# Patient Record
Sex: Female | Born: 1965 | Race: White | Hispanic: No | Smoking: Former smoker
Health system: Southern US, Community
[De-identification: ages and names within clinical notes are randomized; demographics above are authoritative.]

## PROBLEM LIST (undated history)

## (undated) DIAGNOSIS — K219 Gastro-esophageal reflux disease without esophagitis: Secondary | ICD-10-CM

## (undated) DIAGNOSIS — K259 Gastric ulcer, unspecified as acute or chronic, without hemorrhage or perforation: Secondary | ICD-10-CM

## (undated) DIAGNOSIS — T4145XA Adverse effect of unspecified anesthetic, initial encounter: Secondary | ICD-10-CM

## (undated) DIAGNOSIS — M519 Unspecified thoracic, thoracolumbar and lumbosacral intervertebral disc disorder: Secondary | ICD-10-CM

## (undated) DIAGNOSIS — R600 Localized edema: Secondary | ICD-10-CM

## (undated) DIAGNOSIS — R000 Tachycardia, unspecified: Secondary | ICD-10-CM

## (undated) DIAGNOSIS — M2550 Pain in unspecified joint: Secondary | ICD-10-CM

## (undated) DIAGNOSIS — M549 Dorsalgia, unspecified: Secondary | ICD-10-CM

## (undated) DIAGNOSIS — R569 Unspecified convulsions: Secondary | ICD-10-CM

## (undated) DIAGNOSIS — K529 Noninfective gastroenteritis and colitis, unspecified: Secondary | ICD-10-CM

## (undated) DIAGNOSIS — K5900 Constipation, unspecified: Secondary | ICD-10-CM

## (undated) DIAGNOSIS — IMO0001 Reserved for inherently not codable concepts without codable children: Secondary | ICD-10-CM

## (undated) DIAGNOSIS — E739 Lactose intolerance, unspecified: Secondary | ICD-10-CM

## (undated) DIAGNOSIS — T8859XA Other complications of anesthesia, initial encounter: Secondary | ICD-10-CM

## (undated) DIAGNOSIS — I10 Essential (primary) hypertension: Secondary | ICD-10-CM

## (undated) HISTORY — PX: BREAST BIOPSY: SHX20

## (undated) HISTORY — DX: Lactose intolerance, unspecified: E73.9

## (undated) HISTORY — DX: Reserved for inherently not codable concepts without codable children: IMO0001

## (undated) HISTORY — DX: Pain in unspecified joint: M25.50

## (undated) HISTORY — DX: Dorsalgia, unspecified: M54.9

## (undated) HISTORY — DX: Essential (primary) hypertension: I10

## (undated) HISTORY — DX: Tachycardia, unspecified: R00.0

## (undated) HISTORY — PX: TMJ ARTHROPLASTY: SHX1066

## (undated) HISTORY — DX: Gastric ulcer, unspecified as acute or chronic, without hemorrhage or perforation: K25.9

## (undated) HISTORY — DX: Localized edema: R60.0

## (undated) HISTORY — PX: WISDOM TOOTH EXTRACTION: SHX21

## (undated) HISTORY — PX: PILONIDAL CYST EXCISION: SHX744

## (undated) HISTORY — DX: Gastro-esophageal reflux disease without esophagitis: K21.9

## (undated) HISTORY — DX: Constipation, unspecified: K59.00

## (undated) HISTORY — DX: Unspecified thoracic, thoracolumbar and lumbosacral intervertebral disc disorder: M51.9

---
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
## 1986-04-03 HISTORY — PX: OTHER SURGICAL HISTORY: SHX169

## 1997-10-07 ENCOUNTER — Encounter: Admission: RE | Admit: 1997-10-07 | Discharge: 1998-01-05 | Payer: Self-pay | Admitting: *Deleted

## 1997-11-09 ENCOUNTER — Inpatient Hospital Stay (HOSPITAL_COMMUNITY): Admission: AD | Admit: 1997-11-09 | Discharge: 1997-11-13 | Payer: Self-pay | Admitting: Obstetrics and Gynecology

## 1997-12-16 ENCOUNTER — Other Ambulatory Visit: Admission: RE | Admit: 1997-12-16 | Discharge: 1997-12-16 | Payer: Self-pay | Admitting: Obstetrics and Gynecology

## 1999-05-24 ENCOUNTER — Other Ambulatory Visit: Admission: RE | Admit: 1999-05-24 | Discharge: 1999-05-24 | Payer: Self-pay | Admitting: Obstetrics and Gynecology

## 2002-07-08 ENCOUNTER — Other Ambulatory Visit: Admission: RE | Admit: 2002-07-08 | Discharge: 2002-07-08 | Payer: Self-pay | Admitting: Obstetrics and Gynecology

## 2002-11-30 ENCOUNTER — Inpatient Hospital Stay: Admission: AD | Admit: 2002-11-30 | Discharge: 2002-11-30 | Payer: Self-pay | Admitting: Obstetrics and Gynecology

## 2003-01-24 ENCOUNTER — Inpatient Hospital Stay (HOSPITAL_COMMUNITY): Admission: AD | Admit: 2003-01-24 | Discharge: 2003-01-26 | Payer: Self-pay | Admitting: Obstetrics and Gynecology

## 2003-03-03 ENCOUNTER — Other Ambulatory Visit: Admission: RE | Admit: 2003-03-03 | Discharge: 2003-03-03 | Payer: Self-pay | Admitting: Obstetrics and Gynecology

## 2004-01-02 DIAGNOSIS — J189 Pneumonia, unspecified organism: Secondary | ICD-10-CM

## 2004-07-11 ENCOUNTER — Ambulatory Visit: Payer: Self-pay | Admitting: Family Medicine

## 2004-10-31 ENCOUNTER — Ambulatory Visit: Payer: Self-pay | Admitting: Internal Medicine

## 2007-04-10 ENCOUNTER — Ambulatory Visit: Payer: Self-pay | Admitting: Family Medicine

## 2007-04-18 ENCOUNTER — Ambulatory Visit: Payer: Self-pay | Admitting: Family Medicine

## 2007-04-25 ENCOUNTER — Telehealth (INDEPENDENT_AMBULATORY_CARE_PROVIDER_SITE_OTHER): Payer: Self-pay | Admitting: Internal Medicine

## 2007-06-16 ENCOUNTER — Emergency Department (HOSPITAL_COMMUNITY): Admission: EM | Admit: 2007-06-16 | Discharge: 2007-06-16 | Payer: Self-pay | Admitting: Family Medicine

## 2007-12-10 ENCOUNTER — Ambulatory Visit: Payer: Self-pay | Admitting: Family Medicine

## 2007-12-10 DIAGNOSIS — K219 Gastro-esophageal reflux disease without esophagitis: Secondary | ICD-10-CM

## 2007-12-10 DIAGNOSIS — E669 Obesity, unspecified: Secondary | ICD-10-CM | POA: Insufficient documentation

## 2007-12-11 DIAGNOSIS — Z8679 Personal history of other diseases of the circulatory system: Secondary | ICD-10-CM | POA: Insufficient documentation

## 2009-02-23 ENCOUNTER — Emergency Department (HOSPITAL_COMMUNITY): Admission: EM | Admit: 2009-02-23 | Discharge: 2009-02-23 | Payer: Self-pay | Admitting: Family Medicine

## 2009-09-04 ENCOUNTER — Emergency Department (HOSPITAL_COMMUNITY): Admission: EM | Admit: 2009-09-04 | Discharge: 2009-09-04 | Payer: Self-pay | Admitting: Emergency Medicine

## 2010-04-03 LAB — HM HEPATITIS C SCREENING LAB: HM Hepatitis Screen: NEGATIVE

## 2010-04-03 LAB — HM HIV SCREENING LAB: HM HIV Screening: NEGATIVE

## 2010-05-27 ENCOUNTER — Inpatient Hospital Stay (INDEPENDENT_AMBULATORY_CARE_PROVIDER_SITE_OTHER)
Admission: RE | Admit: 2010-05-27 | Discharge: 2010-05-27 | Disposition: A | Discharge status: Home / Self Care | Payer: No Typology Code available for payment source | Source: Ambulatory Visit | Attending: Family Medicine | Admitting: Family Medicine

## 2010-05-27 DIAGNOSIS — J069 Acute upper respiratory infection, unspecified: Secondary | ICD-10-CM

## 2010-05-27 DIAGNOSIS — J029 Acute pharyngitis, unspecified: Secondary | ICD-10-CM

## 2010-06-10 ENCOUNTER — Ambulatory Visit: Payer: No Typology Code available for payment source | Admitting: Family Medicine

## 2010-06-13 ENCOUNTER — Ambulatory Visit (INDEPENDENT_AMBULATORY_CARE_PROVIDER_SITE_OTHER): Payer: No Typology Code available for payment source | Admitting: Family Medicine

## 2010-06-13 ENCOUNTER — Encounter: Payer: Self-pay | Admitting: Family Medicine

## 2010-06-13 DIAGNOSIS — L989 Disorder of the skin and subcutaneous tissue, unspecified: Secondary | ICD-10-CM | POA: Insufficient documentation

## 2010-06-13 DIAGNOSIS — K219 Gastro-esophageal reflux disease without esophagitis: Secondary | ICD-10-CM
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
## 2010-08-03 ENCOUNTER — Encounter: Payer: Self-pay | Admitting: Family Medicine

## 2010-08-04 ENCOUNTER — Ambulatory Visit (INDEPENDENT_AMBULATORY_CARE_PROVIDER_SITE_OTHER)
Admission: RE | Admit: 2010-08-04 | Discharge: 2010-08-04 | Disposition: A | Discharge status: Home / Self Care | Payer: No Typology Code available for payment source | Source: Ambulatory Visit | Attending: Family Medicine | Admitting: Family Medicine

## 2010-08-04 ENCOUNTER — Encounter: Payer: Self-pay | Admitting: Family Medicine

## 2010-08-04 ENCOUNTER — Ambulatory Visit (INDEPENDENT_AMBULATORY_CARE_PROVIDER_SITE_OTHER): Payer: No Typology Code available for payment source | Admitting: Family Medicine

## 2010-08-04 VITALS — BP 112/80 | HR 84 | Temp 98.1°F | Wt 236.1 lb

## 2010-08-04 DIAGNOSIS — M79645 Pain in left finger(s): Secondary | ICD-10-CM | POA: Insufficient documentation

## 2010-08-04 DIAGNOSIS — M79609 Pain in unspecified limb: Secondary | ICD-10-CM
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
## 2010-08-04 NOTE — Progress Notes (Signed)
Hyperextended the L 5th DIP on Easter.  She splinted it.  Painful, minimal DIP flexion.  More puffy at the end of the day.   Has mirena IUD  Meds, vitals, and allergies reviewed.   ROS: See HPI.  Otherwise, noncontributory.  nad L hand with normal inspection except for erythema, edema, and tenderness of 5th DIP.  Distally nv intact but weak on flex and ext at the 5th DIP.   Xray reviewed and fx noted

## 2010-08-04 NOTE — Patient Instructions (Signed)
See Shirlee Limerick about your referral before your leave today. Wear the splint all the time.  Take care.

## 2010-08-16 NOTE — Assessment & Plan Note (Signed)
[facility]                                 ON-CALL NOTE   JUDEEN, GERALDS                    MRN:          [id number]  DATE:06/16/2007                            DOB:          07-Oct-1965    PHONE:  Phone number [phone].   PRIMARY:  Billie Bean.   SUBJECTIVE:  Trica states that she has had a history of recent pneumonia  as well as recurrent pneumonia.  She states that she was told to call if  she has symptoms similar to what she has had in the past.  She currently  mentions chest congestion, hearing rattling in her chest and some  shortness of breath and wheezing.  She denies fever.   ASSESSMENT/PLAN:  Recommended being evaluated at an urgent care today  unless the shortness of breath gets more severe and in that case be seen  at the ER.     Kerby Nora, MD  Electronically Signed    AB/MedQ  DD: 06/16/2007  DT: 06/16/2007  Job #: [id number]
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
## 2010-08-19 NOTE — Op Note (Signed)
   NAME:  Jodi Ballard, Jodi Ballard                       ACCOUNT NO.:  [id number]   MEDICAL RECORD NO.:  [id number]                   PATIENT TYPE:  INP   LOCATION:  9167                                 FACILITY:  [facility]   PHYSICIAN:  Lenoard Aden, M.D.             DATE OF BIRTH:  1965/08/03   DATE OF PROCEDURE:  01/23/2003  DATE OF DISCHARGE:                                 OPERATIVE REPORT   INDICATIONS FOR OPERATIVE DELIVERY:  Nonreassuring fetal heart rate tracing  with fetal bradycardia.   POSTOPERATIVE DIAGNOSIS:  Nonreassuring fetal heart rate tracing with fetal  bradycardia.   PROCEDURE:  Outlet vacuum-assisted vaginal delivery.   SURGEON:  Lenoard Aden, M.D.   ESTIMATED BLOOD LOSS:  500 mL.   LACERATIONS:  No lacerations.   DISPOSITION:  Patient to recovery room in good condition.   DESCRIPTION OF PROCEDURE:  The patient is pushing well with the fetal vertex  at LOA at less than 45 degrees, +3-+4 station, Kiwi cup placed in proper  location for one pull.  Full-term living female over an intact perineum.  Apgars 8 and 9.  Placenta delivered spontaneously intact.  Three-vessel cord  noted.   ESTIMATED BLOOD LOSS:  As noted.   No lacerations, cervical or vaginal, noted.  Bulb suction of the fetus  performed on the perineum.  Mother and baby tolerated the procedure well.  Recovery room in good condition.                                               Lenoard Aden, M.D.    RJT/MEDQ  D:  01/24/2003  T:  01/24/2003  Job:  [id number]

## 2010-11-03 ENCOUNTER — Ambulatory Visit (INDEPENDENT_AMBULATORY_CARE_PROVIDER_SITE_OTHER): Payer: No Typology Code available for payment source | Admitting: Family Medicine

## 2010-11-03 ENCOUNTER — Encounter: Payer: Self-pay | Admitting: Family Medicine

## 2010-11-03 VITALS — BP 130/100 | HR 82 | Temp 98.6°F | Wt 238.0 lb

## 2010-11-03 DIAGNOSIS — J020 Streptococcal pharyngitis: Secondary | ICD-10-CM | POA: Insufficient documentation

## 2010-11-03 DIAGNOSIS — J029 Acute pharyngitis, unspecified: Secondary | ICD-10-CM

## 2010-11-03 LAB — POCT RAPID STREP A (OFFICE): Rapid Strep A Screen: NEGATIVE

## 2010-11-03 MED ORDER — LIDOCAINE VISCOUS 2 % MT SOLN: 5.0000 mL | OROMUCOSAL | Status: AC | PRN

## 2010-11-03 MED ORDER — AMOXICILLIN 875 MG PO TABS: 875.0000 mg | ORAL_TABLET | Freq: Two times a day (BID) | ORAL | Status: AC

## 2010-11-03 NOTE — Assessment & Plan Note (Addendum)
RST negative, presumed false neg.   + fever, ST, minimal cough, tender LA and exudates present.  Start amoxil and f/u prn.  Lidocaine prn for St.  Supportive tx.  She understood.

## 2010-11-03 NOTE — Patient Instructions (Signed)
Start the amoxil today.  Drink plenty of fluids, take tylenol as needed, and gargle with warm salt water/use the lidocaine for your throat.  This should gradually improve.  Take care.  Let us know if you have other concerns.

## 2010-11-03 NOTE — Progress Notes (Signed)
Sx started Sunday, fever since Tuesday.  Fever up to 102 at night.  ST and feels weak.  Aches and chill.  Some postnasal gtt and sinus pressure.  Using nasal saline.  Pressure behind the eyes.  No known sick contacts.   Minimal cough.  St is the main complaint.   Meds, vitals, and allergies reviewed.   ROS: See HPI.  Otherwise, noncontributory.  GEN: nad, alert and oriented HEENT: mucous membranes moist, tm w/o erythema, nasal exam w/o erythema, clear discharge noted,  OP with cobblestoning/erythema/exudates NECK: supple w/ tender LA CV: rrr.   PULM: ctab, no inc wob EXT: no edema SKIN: no acute rash

## 2011-05-07 ENCOUNTER — Ambulatory Visit: Payer: Self-pay | Admitting: Family Medicine

## 2011-05-07 ENCOUNTER — Telehealth: Payer: Self-pay

## 2011-05-07 VITALS — BP 132/97 | HR 121 | Temp 98.2°F | Resp 16 | Ht 62.0 in | Wt 215.0 lb

## 2011-05-07 DIAGNOSIS — L0231 Cutaneous abscess of buttock: Secondary | ICD-10-CM

## 2011-05-07 DIAGNOSIS — L0291 Cutaneous abscess, unspecified: Secondary | ICD-10-CM

## 2011-05-07 DIAGNOSIS — L03317 Cellulitis of buttock: Secondary | ICD-10-CM

## 2011-05-07 MED ORDER — TRAMADOL HCL 50 MG PO TABS: 50.0000 mg | ORAL_TABLET | Freq: Three times a day (TID) | ORAL | Status: AC | PRN

## 2011-05-07 MED ORDER — TRAMADOL HCL 50 MG PO TABS: 50.0000 mg | ORAL_TABLET | Freq: Three times a day (TID) | ORAL | Status: DC | PRN

## 2011-05-07 MED ORDER — DOXYCYCLINE HYCLATE 100 MG PO TABS: 100.0000 mg | ORAL_TABLET | Freq: Two times a day (BID) | ORAL | Status: AC

## 2011-05-07 MED ORDER — DOXYCYCLINE HYCLATE 100 MG PO TABS: 100.0000 mg | ORAL_TABLET | Freq: Two times a day (BID) | ORAL | Status: DC
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
## 2011-05-07 NOTE — Telephone Encounter (Signed)
Rx was faxed to pharmacy already.

## 2011-05-07 NOTE — Patient Instructions (Addendum)
WOUND CARE Please return in 2 days to have your packing removed or sooner if you have concerns. Marland Kitchen Keep the dressing in place.  Add more padding if it leaks. Use warm compresses several times daily.

## 2011-05-07 NOTE — Telephone Encounter (Signed)
Pt saw Chelle today and was not given Doxycycline. Pharmacy does not have record of this Rx. Please contact [facility].

## 2011-05-07 NOTE — Telephone Encounter (Signed)
The Rx printed.  Please call in Doxycycline. csj

## 2011-05-07 NOTE — Progress Notes (Signed)
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 She had no fever, no trouble going to the bathroom, and no other lesions.  Objective: patient has an approximately 10 cm area of erythema on her right, the center centimeters of which is indurated. I&D will be attempted.  Assessment: Large right gluteal abscess not involving the anus or perirectal area.  Plan: Tramadol and doxycycline with close followup.

## 2011-05-09 ENCOUNTER — Ambulatory Visit (INDEPENDENT_AMBULATORY_CARE_PROVIDER_SITE_OTHER): Payer: Self-pay | Admitting: Physician Assistant

## 2011-05-09 VITALS — BP 123/72 | HR 74 | Temp 98.8°F | Resp 16 | Ht 63.0 in | Wt 232.0 lb

## 2011-05-09 DIAGNOSIS — L0231 Cutaneous abscess of buttock: Secondary | ICD-10-CM

## 2011-05-09 DIAGNOSIS — M7918 Myalgia, other site: Secondary | ICD-10-CM

## 2011-05-09 DIAGNOSIS — L03317 Cellulitis of buttock: Secondary | ICD-10-CM

## 2011-05-09 NOTE — Progress Notes (Signed)
   Patient ID: KAYLENE DAWN MRN: [id number], DOB: November 15, 1965 46 y.o. Date of Encounter: 05/09/2011, 3:21 PM  Primary Physician: Crawford Givens, MD, MD  Chief Complaint: Wound care   See previous note  HPI: 46 y.o. y/o female presents for wound care of cellulitis/abscess right buttock s/p I&D on 05/07/11. Doing well No issues or complaints Afebrile/ no chills No nausea or vomiting Tolerating Doxycycline Pain controlled. Not needing Ultram No dressing change at home Previous note reviewed   Past Medical History  Diagnosis Date  . GERD (gastroesophageal reflux disease)   . Lumbar disc disease     controlled with exercises  . IUD     Mirena     Home Meds: Prior to Admission medications   Medication Sig Start Date End Date Taking? Authorizing Provider  B Complex-C (B-COMPLEX WITH VITAMIN C) tablet Take 1 tablet by mouth daily.     Yes Historical Provider, MD  calcium-vitamin D (OSCAL WITH D) 500-200 MG-UNIT per tablet Take 1 tablet by mouth daily.     Yes Historical Provider, MD  doxycycline (VIBRA-TABS) 100 MG tablet Take 1 tablet (100 mg total) by mouth 2 (two) times daily. 05/07/11 05/17/11 Yes Chelle Jeffery, PA-C  traMADol (ULTRAM) 50 MG tablet Take 1 tablet (50 mg total) by mouth every 8 (eight) hours as needed for pain. 05/07/11 05/17/11  Porfirio Oar, PA-C    Allergies:  Allergies  Allergen Reactions  . Tetanus Toxoid     REACTION: anaphylactic shock  . Codeine     REACTION: n/ v  . Eggs Or Egg-Derived Products     REACTION: rash    ROS: Constitutional: Afebrile, no chills Cardiovascular: negative for chest pain or palpitations Dermatological: Positive for wound, erythema, pain, and warmth  GI: No nausea or vomiting   EXAM: Physical Exam: Blood pressure 123/72, pulse 74, temperature 98.8 F (37.1 C), temperature source Oral, resp. rate 16, height 5\' 3"  (1.6 m), weight 232 lb (105.235 kg), last menstrual period 04/27/2011. General: Well developed, well  nourished, in no acute distress. Nontoxic appearing. Head: Normocephalic, atraumatic, sclera non-icteric.  Neck: Supple. Lungs: Breathing is unlabored. Heart: Normal rate. Skin:  Warm and moist. Dressing and packing in place. Mild local induration with erythema and tenderness to palpation. Neuro: Alert and oriented X 3. Moves all extremities spontaneously. Normal gait.  Psych:  Responds to questions appropriately with a normal affect.   PROCEDURE: Dressing and packing removed. Small amount of purulence expressed Wound bed healthy Irrigated with 1% plain lidocaine 5 cc. Repacked with 1/4 plain packing Dressing applied  LAB: Culture: Pending  A/P: 46 y.o. y/o female with cellulitis/abscess right buttock  as above s/p I&D on 05/07/11 -wound care per above -continue Doxycycline -Pain well controlled, not needing Ultram -Daily dressing changes -Recheck 48 hours  Signed, Zaden Sako, PA-C 05/09/2011, 3:28 PM

## 2011-05-09 NOTE — Patient Instructions (Signed)

## 2011-05-10 LAB — WOUND CULTURE
Gram Stain: NONE SEEN
Gram Stain: NONE SEEN

## 2011-05-11 ENCOUNTER — Encounter: Payer: Self-pay | Admitting: Physician Assistant

## 2011-05-11 ENCOUNTER — Ambulatory Visit (INDEPENDENT_AMBULATORY_CARE_PROVIDER_SITE_OTHER): Payer: Self-pay | Admitting: Physician Assistant

## 2011-05-11 VITALS — BP 122/80 | HR 76 | Temp 98.6°F | Resp 16 | Ht 62.0 in | Wt 232.8 lb

## 2011-05-11 DIAGNOSIS — L0231 Cutaneous abscess of buttock: Secondary | ICD-10-CM

## 2011-05-11 NOTE — Progress Notes (Signed)
   Patient ID: Jodi Ballard MRN: [id number], DOB: Dec 28, 1965 46 y.o. Date of Encounter: 05/11/2011, 11:56 AM  Primary Physician: Crawford Givens, MD, MD  Chief Complaint: Wound care   See previous note  HPI: 46 y.o. y/o Caucasian female presents for wound care of cellulitis/abscess right buttock s/p I&D on 05/07/11. Doing well No issues or complaints Afebrile/ no chills No nausea or vomiting Tolerating Doxycycline Pain controlled. Not needing Ultram No dressing change at home Previous notes reviewed  Past Medical History  Diagnosis Date  . GERD (gastroesophageal reflux disease)   . Lumbar disc disease     controlled with exercises  . IUD     Mirena     Home Meds: Prior to Admission medications   Medication Sig Start Date End Date Taking? Authorizing Provider  B Complex-C (B-COMPLEX WITH VITAMIN C) tablet Take 1 tablet by mouth daily.     Yes Historical Provider, MD  calcium-vitamin D (OSCAL WITH D) 500-200 MG-UNIT per tablet Take 1 tablet by mouth daily.     Yes Historical Provider, MD  doxycycline (VIBRA-TABS) 100 MG tablet Take 1 tablet (100 mg total) by mouth 2 (two) times daily. 05/07/11 05/17/11 Yes Chelle Jeffery, PA-C  traMADol (ULTRAM) 50 MG tablet Take 1 tablet (50 mg total) by mouth every 8 (eight) hours as needed for pain. 05/07/11 05/17/11  Porfirio Oar, PA-C    Allergies:  Allergies  Allergen Reactions  . Tetanus Toxoid     REACTION: anaphylactic shock  . Codeine     REACTION: n/ v  . Eggs Or Egg-Derived Products     REACTION: rash    ROS: Constitutional: Afebrile, no chills Cardiovascular: negative for chest pain or palpitations Dermatological: Positive for wound. No pain or warmth GI: No nausea or vomiting   EXAM: Physical Exam: Blood pressure 122/80, pulse 76, temperature 98.6 F (37 C), temperature source Oral, resp. rate 16, height 5\' 2"  (1.575 m), weight 232 lb 12.8 oz (105.597 kg), last menstrual period 04/27/2011. General: Well developed,  well nourished, in no acute distress. Nontoxic appearing. Head: Normocephalic, atraumatic, sclera non-icteric.  Neck: Supple. Lungs: Breathing is unlabored. Heart: Regular rate. Skin:  Warm and moist. Dressing and packing in place. Improved induration, minimaml erythema, and mild  tenderness to palpation at 10 o'clock Neuro: Alert and oriented X 3. Moves all extremities spontaneously. Normal gait.  Psych:  Responds to questions appropriately with a normal affect.   PROCEDURE: Dressing and packing removed. No purulence expressed Wound bed healthy Irrigated with 1% plain lidocaine 5 cc. Repacked loosely with 1/4 plain packing Dressing applied  LAB: Culture: pending  A/P: 46 y.o. y/o female with improving cellulitis/abscess right buttock as above s/p I&D on 05/07/11 -wound care per above -continue Doxycycline -Pain well controlled, not needing Ultram -Daily dressing changes -Recheck 48 hours  Elinor Dodge, PA-C 05/11/2011 12:03 PM

## 2011-05-13 ENCOUNTER — Ambulatory Visit (INDEPENDENT_AMBULATORY_CARE_PROVIDER_SITE_OTHER): Payer: Self-pay | Admitting: Family Medicine

## 2011-05-13 VITALS — BP 123/73 | HR 83 | Temp 98.1°F | Resp 16 | Ht 62.0 in | Wt 235.2 lb

## 2011-05-13 DIAGNOSIS — L0390 Cellulitis, unspecified: Secondary | ICD-10-CM

## 2011-05-13 DIAGNOSIS — L0291 Cutaneous abscess, unspecified: Secondary | ICD-10-CM

## 2011-05-13 NOTE — Progress Notes (Signed)
This 46 year old woman comes in for recheck on the right buttock abscess. She's feeling better with less induration, tenderness and drainage  Her mother just got out of the hospital for acute diverticulitis.  The right no longer shows erythema the base of the wound is clean. There is no purulent discharge, no induration, no tenderness. Assessment: Healing abscess  Plan antibiotics Gen. wound care followup unnecessary unless change in pain.

## 2011-10-18 ENCOUNTER — Encounter (HOSPITAL_COMMUNITY): Payer: Self-pay | Admitting: Emergency Medicine

## 2011-10-18 ENCOUNTER — Emergency Department (HOSPITAL_COMMUNITY)
Admission: EM | Admit: 2011-10-18 | Discharge: 2011-10-18 | Disposition: A | Discharge status: Home / Self Care | Payer: BC Managed Care – PPO | Attending: Emergency Medicine | Admitting: Emergency Medicine

## 2011-10-18 ENCOUNTER — Emergency Department (HOSPITAL_COMMUNITY): Payer: BC Managed Care – PPO

## 2011-10-18 DIAGNOSIS — S43409A Unspecified sprain of unspecified shoulder joint, initial encounter: Secondary | ICD-10-CM

## 2011-10-18 DIAGNOSIS — W19XXXA Unspecified fall, initial encounter: Secondary | ICD-10-CM | POA: Insufficient documentation

## 2011-10-18 DIAGNOSIS — Z87891 Personal history of nicotine dependence: Secondary | ICD-10-CM | POA: Insufficient documentation

## 2011-10-18 DIAGNOSIS — IMO0002 Reserved for concepts with insufficient information to code with codable children: Secondary | ICD-10-CM | POA: Insufficient documentation

## 2011-10-18 DIAGNOSIS — K219 Gastro-esophageal reflux disease without esophagitis: Secondary | ICD-10-CM | POA: Insufficient documentation

## 2011-10-18 MED ORDER — IBUPROFEN 800 MG PO TABS: 800.0000 mg | ORAL_TABLET | Freq: Three times a day (TID) | ORAL | Status: AC | PRN

## 2011-10-18 MED ORDER — IBUPROFEN 800 MG PO TABS
800.0000 mg | ORAL_TABLET | Freq: Once | ORAL | Status: AC
  Administered 2011-10-18: 800 mg via ORAL
  Filled 2011-10-18: qty 1

## 2011-10-18 NOTE — ED Notes (Signed)
Pt stepped in hole, fell forward onto RT elbow causing her RT shoulder to jam.  C/O RT shoulder pain. Has ice on it from home.

## 2011-10-18 NOTE — ED Notes (Signed)
Pt states she stepped in hole, fell onto R shoulder causing pain, limited ROM d/t pain.
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
## 2011-10-19 NOTE — ED Provider Notes (Signed)
Medical screening examination/treatment/procedure(s) were performed by non-physician practitioner and as supervising physician I was immediately available for consultation/collaboration.  Windel Keziah R. Sidonia Nutter, MD 10/19/11 0011 

## 2012-08-23 ENCOUNTER — Emergency Department (HOSPITAL_COMMUNITY)
Admission: EM | Admit: 2012-08-23 | Discharge: 2012-08-24 | Disposition: A | Discharge status: Home / Self Care | Payer: BC Managed Care – PPO | Attending: Emergency Medicine | Admitting: Emergency Medicine

## 2012-08-23 ENCOUNTER — Encounter (HOSPITAL_COMMUNITY): Payer: Self-pay | Admitting: *Deleted

## 2012-08-23 DIAGNOSIS — F419 Anxiety disorder, unspecified: Secondary | ICD-10-CM

## 2012-08-23 DIAGNOSIS — K219 Gastro-esophageal reflux disease without esophagitis: Secondary | ICD-10-CM | POA: Insufficient documentation

## 2012-08-23 DIAGNOSIS — Z885 Allergy status to narcotic agent status: Secondary | ICD-10-CM | POA: Insufficient documentation

## 2012-08-23 DIAGNOSIS — F411 Generalized anxiety disorder: Secondary | ICD-10-CM | POA: Insufficient documentation

## 2012-08-23 DIAGNOSIS — Z87891 Personal history of nicotine dependence: Secondary | ICD-10-CM | POA: Insufficient documentation

## 2012-08-23 DIAGNOSIS — M7989 Other specified soft tissue disorders: Secondary | ICD-10-CM | POA: Insufficient documentation

## 2012-08-23 DIAGNOSIS — Z888 Allergy status to other drugs, medicaments and biological substances status: Secondary | ICD-10-CM | POA: Insufficient documentation

## 2012-08-23 DIAGNOSIS — R51 Headache: Secondary | ICD-10-CM | POA: Insufficient documentation

## 2012-08-23 DIAGNOSIS — Z975 Presence of (intrauterine) contraceptive device: Secondary | ICD-10-CM | POA: Insufficient documentation

## 2012-08-23 DIAGNOSIS — R000 Tachycardia, unspecified: Secondary | ICD-10-CM | POA: Insufficient documentation

## 2012-08-23 DIAGNOSIS — R0602 Shortness of breath: Secondary | ICD-10-CM | POA: Insufficient documentation

## 2012-08-23 DIAGNOSIS — Z8739 Personal history of other diseases of the musculoskeletal system and connective tissue: Secondary | ICD-10-CM | POA: Insufficient documentation

## 2012-08-23 LAB — CBC WITH DIFFERENTIAL/PLATELET
Eosinophils Absolute: 0.4 10*3/uL (ref 0.0–0.7)
Eosinophils Relative: 4 % (ref 0–5)
Lymphs Abs: 2.7 10*3/uL (ref 0.7–4.0)
MCH: 30.7 pg (ref 26.0–34.0)
MCV: 89.1 fL (ref 78.0–100.0)
Monocytes Absolute: 0.7 10*3/uL (ref 0.1–1.0)
Platelets: 252 10*3/uL (ref 150–400)
RBC: 4.66 MIL/uL (ref 3.87–5.11)

## 2012-08-23 LAB — POCT I-STAT TROPONIN I

## 2012-08-23 NOTE — ED Notes (Signed)
EKG given to Dr. Manly. Copy placed in pt chart. 

## 2012-08-23 NOTE — ED Notes (Signed)
EMS administered 324mg  ASA and 4mg  IV Zofran
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
## 2012-08-24 ENCOUNTER — Emergency Department (HOSPITAL_COMMUNITY): Payer: BC Managed Care – PPO

## 2012-08-24 LAB — COMPREHENSIVE METABOLIC PANEL
ALT: 14 U/L (ref 0–35)
BUN: 14 mg/dL (ref 6–23)
Calcium: 9.2 mg/dL (ref 8.4–10.5)
Creatinine, Ser: 0.81 mg/dL (ref 0.50–1.10)
GFR calc Af Amer: >90 mL/min (ref >90)
Glucose, Bld: 102 mg/dL — ABNORMAL HIGH (ref 70–99)
Sodium: 140 mEq/L (ref 135–145)
Total Protein: 7.6 g/dL (ref 6.0–8.3)

## 2012-08-24 LAB — URINALYSIS, ROUTINE W REFLEX MICROSCOPIC
Nitrite: NEGATIVE
Specific Gravity, Urine: 1.03 (ref 1.005–1.030)
Urobilinogen, UA: 0.2 mg/dL (ref 0.0–1.0)
pH: 5.5 (ref 5.0–8.0)

## 2012-08-24 LAB — PRO B NATRIURETIC PEPTIDE: Pro B Natriuretic peptide (BNP): 25.7 pg/mL (ref 0–125)

## 2012-08-24 LAB — PROTIME-INR
INR: 0.99 (ref 0.00–1.49)
Prothrombin Time: 13 seconds (ref 11.6–15.2)

## 2012-08-24 LAB — URINE MICROSCOPIC-ADD ON

## 2012-08-24 MED ORDER — SODIUM CHLORIDE 0.9 % IV BOLUS (SEPSIS): 1000.0000 mL | Freq: Once | INTRAVENOUS | Status: DC

## 2012-08-24 NOTE — ED Notes (Signed)
IV removed site without redness or swelling, cath intact

## 2012-08-24 NOTE — ED Notes (Signed)
IV fluids ordered after patient was discharged.

## 2012-08-25 LAB — URINE CULTURE: Colony Count: 75000

## 2012-10-06 ENCOUNTER — Emergency Department (HOSPITAL_COMMUNITY): Payer: BC Managed Care – PPO

## 2012-10-06 ENCOUNTER — Encounter (HOSPITAL_COMMUNITY): Payer: Self-pay | Admitting: Emergency Medicine

## 2012-10-06 ENCOUNTER — Inpatient Hospital Stay (HOSPITAL_COMMUNITY)
Admission: EM | Admit: 2012-10-06 | Discharge: 2012-10-10 | DRG: 813 | Disposition: A | Discharge status: Home / Self Care | Payer: BC Managed Care – PPO | Attending: Internal Medicine | Admitting: Internal Medicine

## 2012-10-06 DIAGNOSIS — K529 Noninfective gastroenteritis and colitis, unspecified: Secondary | ICD-10-CM | POA: Diagnosis present

## 2012-10-06 DIAGNOSIS — R112 Nausea with vomiting, unspecified: Secondary | ICD-10-CM | POA: Diagnosis present

## 2012-10-06 DIAGNOSIS — K219 Gastro-esophageal reflux disease without esophagitis: Secondary | ICD-10-CM | POA: Diagnosis present

## 2012-10-06 DIAGNOSIS — K5289 Other specified noninfective gastroenteritis and colitis: Principal | ICD-10-CM | POA: Diagnosis present

## 2012-10-06 DIAGNOSIS — I880 Nonspecific mesenteric lymphadenitis: Secondary | ICD-10-CM | POA: Diagnosis present

## 2012-10-06 DIAGNOSIS — R109 Unspecified abdominal pain: Secondary | ICD-10-CM

## 2012-10-06 DIAGNOSIS — R197 Diarrhea, unspecified: Secondary | ICD-10-CM | POA: Diagnosis present

## 2012-10-06 DIAGNOSIS — K2990 Gastroduodenitis, unspecified, without bleeding: Secondary | ICD-10-CM | POA: Diagnosis present

## 2012-10-06 DIAGNOSIS — R1013 Epigastric pain: Secondary | ICD-10-CM | POA: Diagnosis present

## 2012-10-06 DIAGNOSIS — E876 Hypokalemia: Secondary | ICD-10-CM | POA: Diagnosis present

## 2012-10-06 DIAGNOSIS — R142 Eructation: Secondary | ICD-10-CM | POA: Diagnosis present

## 2012-10-06 DIAGNOSIS — K2970 Gastritis, unspecified, without bleeding: Secondary | ICD-10-CM | POA: Diagnosis present

## 2012-10-06 DIAGNOSIS — R141 Gas pain: Secondary | ICD-10-CM | POA: Diagnosis present

## 2012-10-06 DIAGNOSIS — R590 Localized enlarged lymph nodes: Secondary | ICD-10-CM

## 2012-10-06 DIAGNOSIS — Z6841 Body Mass Index (BMI) 40.0 and over, adult: Secondary | ICD-10-CM

## 2012-10-06 DIAGNOSIS — D72825 Bandemia: Secondary | ICD-10-CM

## 2012-10-06 DIAGNOSIS — E860 Dehydration: Secondary | ICD-10-CM

## 2012-10-06 HISTORY — DX: Unspecified convulsions: R56.9

## 2012-10-06 HISTORY — DX: Noninfective gastroenteritis and colitis, unspecified: K52.9

## 2012-10-06 HISTORY — DX: Other complications of anesthesia, initial encounter: T88.59XA

## 2012-10-06 HISTORY — DX: Adverse effect of unspecified anesthetic, initial encounter: T41.45XA

## 2012-10-06 LAB — OCCULT BLOOD X 1 CARD TO LAB, STOOL: Fecal Occult Bld: NEGATIVE

## 2012-10-06 LAB — COMPREHENSIVE METABOLIC PANEL
ALT: 20 U/L (ref 0–35)
Calcium: 8.1 mg/dL — ABNORMAL LOW (ref 8.4–10.5)
GFR calc Af Amer: >90 mL/min (ref >90)
Glucose, Bld: 95 mg/dL (ref 70–99)
Sodium: 135 mEq/L (ref 135–145)
Total Protein: 7 g/dL (ref 6.0–8.3)

## 2012-10-06 LAB — CBC WITH DIFFERENTIAL/PLATELET
Basophils Relative: 1 % (ref 0–1)
Eosinophils Absolute: 0.3 10*3/uL (ref 0.0–0.7)
MCH: 29.6 pg (ref 26.0–34.0)
MCHC: 34.1 g/dL (ref 30.0–36.0)
Monocytes Absolute: 0.7 10*3/uL (ref 0.1–1.0)
Neutrophils Relative %: 55 % (ref 43–77)
Platelets: 239 10*3/uL (ref 150–400)
WBC Morphology: INCREASED

## 2012-10-06 LAB — URINALYSIS, ROUTINE W REFLEX MICROSCOPIC
Ketones, ur: NEGATIVE mg/dL
Nitrite: NEGATIVE
Protein, ur: NEGATIVE mg/dL
Urobilinogen, UA: 0.2 mg/dL (ref 0.0–1.0)

## 2012-10-06 LAB — URINE MICROSCOPIC-ADD ON

## 2012-10-06 LAB — CLOSTRIDIUM DIFFICILE BY PCR: Toxigenic C. Difficile by PCR: NEGATIVE

## 2012-10-06 MED ORDER — METRONIDAZOLE IN NACL 5-0.79 MG/ML-% IV SOLN
500.0000 mg | Freq: Three times a day (TID) | INTRAVENOUS | Status: DC
  Administered 2012-10-07 – 2012-10-10 (×10): 500 mg via INTRAVENOUS
  Filled 2012-10-06 (×12): qty 100

## 2012-10-06 MED ORDER — POTASSIUM CHLORIDE IN NACL 40-0.9 MEQ/L-% IV SOLN
INTRAVENOUS | Status: DC
  Administered 2012-10-06 – 2012-10-09 (×5): via INTRAVENOUS
  Filled 2012-10-06 (×8): qty 1000

## 2012-10-06 MED ORDER — IOHEXOL 300 MG/ML  SOLN
100.0000 mL | Freq: Once | INTRAMUSCULAR | Status: AC | PRN
  Administered 2012-10-06: 100 mL via INTRAVENOUS

## 2012-10-06 MED ORDER — POTASSIUM CHLORIDE 20 MEQ/15ML (10%) PO LIQD
40.0000 meq | Freq: Once | ORAL | Status: AC
  Administered 2012-10-06: 40 meq via ORAL
  Filled 2012-10-06: qty 30

## 2012-10-06 MED ORDER — POTASSIUM CHLORIDE 10 MEQ/100ML IV SOLN
10.0000 meq | Freq: Once | INTRAVENOUS | Status: AC
  Administered 2012-10-06: 10 meq via INTRAVENOUS
  Filled 2012-10-06: qty 100

## 2012-10-06 MED ORDER — ACETAMINOPHEN 325 MG PO TABS: 650.0000 mg | ORAL_TABLET | Freq: Four times a day (QID) | ORAL | Status: DC | PRN

## 2012-10-06 MED ORDER — METRONIDAZOLE IN NACL 5-0.79 MG/ML-% IV SOLN
500.0000 mg | Freq: Once | INTRAVENOUS | Status: AC
  Administered 2012-10-06: 500 mg via INTRAVENOUS
  Filled 2012-10-06: qty 100

## 2012-10-06 MED ORDER — IOHEXOL 300 MG/ML  SOLN
50.0000 mL | Freq: Once | INTRAMUSCULAR | Status: AC | PRN
  Administered 2012-10-06: 50 mL via ORAL

## 2012-10-06 MED ORDER — ONDANSETRON HCL 4 MG/2ML IJ SOLN: 4.0000 mg | INTRAMUSCULAR | Status: DC | PRN

## 2012-10-06 MED ORDER — ONDANSETRON HCL 4 MG/2ML IJ SOLN: 4.0000 mg | Freq: Four times a day (QID) | INTRAMUSCULAR | Status: DC | PRN

## 2012-10-06 MED ORDER — ONDANSETRON HCL 4 MG/2ML IJ SOLN: 4.0000 mg | Freq: Three times a day (TID) | INTRAMUSCULAR | Status: DC | PRN

## 2012-10-06 MED ORDER — CIPROFLOXACIN IN D5W 400 MG/200ML IV SOLN
400.0000 mg | Freq: Two times a day (BID) | INTRAVENOUS | Status: DC
  Administered 2012-10-07 – 2012-10-10 (×7): 400 mg via INTRAVENOUS
  Filled 2012-10-06 (×8): qty 200

## 2012-10-06 MED ORDER — ALUM & MAG HYDROXIDE-SIMETH 200-200-20 MG/5ML PO SUSP: 30.0000 mL | Freq: Four times a day (QID) | ORAL | Status: DC | PRN

## 2012-10-06 MED ORDER — FAMOTIDINE IN NACL 20-0.9 MG/50ML-% IV SOLN
20.0000 mg | Freq: Once | INTRAVENOUS | Status: AC
  Administered 2012-10-06: 20 mg via INTRAVENOUS
  Filled 2012-10-06: qty 50

## 2012-10-06 MED ORDER — SODIUM CHLORIDE 0.9 % IV SOLN
INTRAVENOUS | Status: DC
  Administered 2012-10-06: 20:00:00 via INTRAVENOUS

## 2012-10-06 MED ORDER — CIPROFLOXACIN IN D5W 400 MG/200ML IV SOLN
400.0000 mg | Freq: Once | INTRAVENOUS | Status: AC
  Administered 2012-10-06: 400 mg via INTRAVENOUS
  Filled 2012-10-06: qty 200

## 2012-10-06 MED ORDER — MORPHINE SULFATE 4 MG/ML IJ SOLN: 4.0000 mg | INTRAMUSCULAR | Status: DC | PRN

## 2012-10-06 MED ORDER — ACETAMINOPHEN 650 MG RE SUPP: 650.0000 mg | Freq: Four times a day (QID) | RECTAL | Status: DC | PRN

## 2012-10-06 MED ORDER — SODIUM CHLORIDE 0.9 % IV SOLN
INTRAVENOUS | Status: DC
  Administered 2012-10-06: 22:00:00 via INTRAVENOUS

## 2012-10-06 MED ORDER — FAMOTIDINE IN NACL 20-0.9 MG/50ML-% IV SOLN
20.0000 mg | Freq: Two times a day (BID) | INTRAVENOUS | Status: DC
  Administered 2012-10-06 – 2012-10-10 (×8): 20 mg via INTRAVENOUS
  Filled 2012-10-06 (×9): qty 50

## 2012-10-06 MED ORDER — ONDANSETRON HCL 4 MG PO TABS: 4.0000 mg | ORAL_TABLET | Freq: Four times a day (QID) | ORAL | Status: DC | PRN

## 2012-10-06 MED ORDER — OXYCODONE HCL 5 MG PO TABS: 5.0000 mg | ORAL_TABLET | ORAL | Status: DC | PRN

## 2012-10-06 MED ORDER — ADULT MULTIVITAMIN W/MINERALS CH
1.0000 | ORAL_TABLET | Freq: Every day | ORAL | Status: DC
  Administered 2012-10-07 – 2012-10-10 (×3): 1 via ORAL
  Filled 2012-10-06 (×5): qty 1

## 2012-10-06 MED ORDER — SODIUM CHLORIDE 0.9 % IV SOLN
INTRAVENOUS | Status: DC
  Administered 2012-10-06 (×2): via INTRAVENOUS

## 2012-10-06 NOTE — ED Notes (Signed)
Patient reports that she has had several loose stools x 1 week. The patient reports that she is unable to eat and drink normally
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
## 2012-10-07 LAB — GI PATHOGEN PANEL BY PCR, STOOL
C difficile toxin A/B: NEGATIVE
Campylobacter by PCR: NEGATIVE
Cryptosporidium by PCR: NEGATIVE
E coli (STEC): NEGATIVE
G lamblia by PCR: POSITIVE
Rotavirus A by PCR: NEGATIVE

## 2012-10-07 LAB — BASIC METABOLIC PANEL
CO2: 22 mEq/L (ref 19–32)
Calcium: 7.5 mg/dL — ABNORMAL LOW (ref 8.4–10.5)
Chloride: 110 mEq/L (ref 96–112)
Creatinine, Ser: 0.8 mg/dL (ref 0.50–1.10)
Glucose, Bld: 89 mg/dL (ref 70–99)

## 2012-10-07 NOTE — Progress Notes (Signed)
[facility] PROGRESS NOTE  Jodi Ballard ZOX:[id number] DOB: 1966-02-09 DOA: 10/06/2012 PCP: Jodi Givens, MD  Brief narrative: Jodi Ballard is an 47 y.o. female with a PMH of remote colitis, GERD, but no other significant history who presented to the ER on 10/06/2012 with severe diarrhea, hypokalemia and mesenteric lymphadenopathy noted on CT scan.   Assessment/Plan: Principal Problem:  Diarrhea with mesenteric lymphadentitis  -Admitted and placed on empiric IV Cipro/Flagyl.  -Etiology of mesenteric lymphadenitis is usually infectious (eg, Y. enterocolitica, M. tuberculosis, Salmonella species), malignant, or autoimmune. ESR WNL, CRP slightly elevated. -F/U  GI pathogen profile, fecal lactoferrin. If unrevealing, will need GI consult for consideration of colonoscopy. C. Diff PCR negative. -Hydrating.  Active Problems:  GERD  -Continue IV Pepcid.  Hypokalemia  -Replacing in IV fluids.  Code Status: Full.  Family Communication: No family currently at the bedside. Patient identifies mother as emergency contact: Jodi Ballard [phone].  Disposition Plan: Home when stable.  Medical Consultants:  None.  Other Consultants:  None.  Anti-infectives:  Cipro 10/06/2012--->  Flagyl 10/06/2012--->  HPI/Subjective: Jodi Ballard has had approximately 5 loose stools since admission. No bloody diarrhea. No further nausea/vomiting. Abdominal cramping has improved.  Objective: Filed Vitals:   10/06/12 1537 10/06/12 1837 10/06/12 2132 10/07/12 0615  BP: 126/73 133/98 107/89 121/60  Pulse: 88 110 76 98  Temp: 98.4 F (36.9 C) 98.3 F (36.8 C) 98.4 F (36.9 C) 98.1 F (36.7 C)  TempSrc: Oral Oral Oral Oral  Resp: 18 18 18 18   Height:   5\' 2"  (1.575 m)   Weight:   108.591 kg (239 lb 6.4 oz)   SpO2: 98% 100% 93% 98%    Intake/Output Summary (Last 24 hours) at 10/07/12 0900 Last data filed at 10/07/12 0617  Gross per 24 hour  Intake 821.67 ml  Output      0 ml   Net 821.67 ml    Exam: Gen:  NAD Cardiovascular:  RRR, No M/R/G Respiratory:  Lungs CTAB Gastrointestinal:  Abdomen soft, NT/ND, + BS Extremities:  No C/E/C  Data Reviewed: Basic Metabolic Panel:  Recent Labs Lab 10/06/12 1146  NA 135  K 2.7*  CL 104  CO2 21  GLUCOSE 95  BUN 7  CREATININE 0.83  CALCIUM 8.1*   GFR Estimated Creatinine Clearance: 96.2 ml/min (by C-G formula based on Cr of 0.83). Liver Function Tests:  Recent Labs Lab 10/06/12 1146  AST 19  ALT 20  ALKPHOS 94  BILITOT 0.4  PROT 7.0  ALBUMIN 3.3*    Recent Labs Lab 10/06/12 1146  LIPASE 27   CBC:  Recent Labs Lab 10/06/12 1146  WBC 5.3  NEUTROABS 2.8  HGB 13.6  HCT 39.9  MCV 86.7  PLT 239   BNP (last 3 results)  Recent Labs  08/23/12 2339  PROBNP 25.7   Microbiology Recent Results (from the past 240 hour(s))  CLOSTRIDIUM DIFFICILE BY PCR     Status: None   Collection Time    10/06/12  3:30 PM      Result Value Range Status   C difficile by pcr NEGATIVE  NEGATIVE Final     Procedures and Diagnostic Studies: Dg Chest 2 View  10/06/2012   *RADIOLOGY REPORT*  Clinical Data: Upper abdominal pain and diarrhea.  CHEST - 2 VIEW  Comparison: 08/24/2012  Findings: Two views of the chest demonstrate clear lungs.  No focal airspace disease or edema. Heart and mediastinum are within normal limits.  Degenerative changes near  the thoracolumbar junction.  IMPRESSION: No acute cardiopulmonary disease.   Original Report Authenticated By: Richarda Overlie, M.D.   Ct Abdomen Pelvis W Contrast  10/06/2012   *RADIOLOGY REPORT*  Clinical Data: Diarrhea  CT ABDOMEN AND PELVIS WITH CONTRAST  Technique:  Multidetector CT imaging of the abdomen and pelvis was performed following the standard protocol during bolus administration of intravenous contrast.  Contrast: 50mL OMNIPAQUE IOHEXOL 300 MG/ML  SOLN, OMNIPAQUE IOHEXOL 300 MG/ML  SOLN  Comparison: None.  Findings: The lung bases are clear.  No pleural  effusions.  There is mild diffuse low attenuation within the liver parenchyma suggesting fatty infiltration.  Small low attenuation structure in the right hepatic lobe measures 3 mm, image 18/series 2.  Within the left hepatic lobe there is a 5 mm low attenuation structure, image 26/series 2.  The gallbladder appears normal.  No biliary dilatation.  The pancreas is normal. Normal appearance of the spleen.  The adrenal glands are both normal.  Normal appearance of both kidneys.  The urinary bladder appears unremarkable.  There is an IUD within the uterine cavity.  Normal caliber of the abdominal aorta. Increased number of mesenteric lymph nodes are identified.  There are several the borderline enlarged mesenteric lymph nodes identified.  The largest is in the ileocolic region measuring 1.1 cm, image 48/series 2. There is no enlarged retroperitoneal lymph nodes.  No pelvic or inguinal adenopathy.  There is a small hiatal hernia.  The small bowel loops are mildly increased in caliber containing multiple fluid levels.  These measure up to 2.4 cm.  Enteric contrast material is identified throughout the small bowel and colon up to the level of the rectum.  There is no abnormal small or large bowel wall thickening.  There is no significant free fluid or abnormal fluid collections identified.  No evidence for bowel perforation.  No free air  There is a small right inguinal hernia containing fat only.  Review of the visualized bony structures is significant for mild multilevel degenerative disc disease.  IMPRESSION:  1. Multiple small bowel lymph nodes are identified.  These are more abnormal in their multiplicity than abnormal in size. Findings may reflect reactive adenopathy or mesenteric adenitis.  2.  Mildly prominent small bowel loops with air-fluid levels.  No evidence for bowel obstruction.  Enteric contrast material is seen throughout the small bowel up to the rectum. 3.  IUD is noted within the uterine cavity. 4.   Low attenuation structures within the liver parenchyma are too small to characterize.   Original Report Authenticated By: Signa Kell, M.D.    Scheduled Meds: . sodium chloride   Intravenous STAT  . ciprofloxacin  400 mg Intravenous Q12H  . famotidine (PEPCID) IV  20 mg Intravenous Q12H  . metronidazole  500 mg Intravenous Q8H  . multivitamin with minerals  1 tablet Oral Daily   Continuous Infusions: . 0.9 % NaCl with KCl 40 mEq / L 100 mL/hr at 10/06/12 2204    Time spent: 25 minutes.   LOS: 1 day   Jodi Ballard  [facility] Pager [phone].   *Please note that the hospitalists switch teams on Wednesdays. Please call the flow manager at [phone] if you are having difficulty reaching the hospitalist taking care of this patient as she can update you and provide the most up-to-date pager number of provider caring for the patient. If 8PM-8AM, please contact night-coverage at [email or web address], password [facility]  10/07/2012, 9:00 AM

## 2012-10-08 LAB — BASIC METABOLIC PANEL
BUN: <3 mg/dL — ABNORMAL LOW (ref 6–23)
CO2: 23 mEq/L (ref 19–32)
Calcium: 7.6 mg/dL — ABNORMAL LOW (ref 8.4–10.5)
GFR calc non Af Amer: >90 mL/min (ref >90)
Glucose, Bld: 107 mg/dL — ABNORMAL HIGH (ref 70–99)
Potassium: 3.3 mEq/L — ABNORMAL LOW (ref 3.5–5.1)

## 2012-10-08 LAB — FECAL LACTOFERRIN, QUANT

## 2012-10-08 MED ORDER — PEG 3350-KCL-NA BICARB-NACL 420 G PO SOLR
4000.0000 mL | Freq: Once | ORAL | Status: AC
  Administered 2012-10-08: 4000 mL via ORAL
  Filled 2012-10-08: qty 4000

## 2012-10-08 MED ORDER — DIPHENOXYLATE-ATROPINE 2.5-0.025 MG PO TABS: 2.0000 | ORAL_TABLET | Freq: Four times a day (QID) | ORAL | Status: DC | PRN

## 2012-10-08 MED ORDER — POTASSIUM CHLORIDE CRYS ER 20 MEQ PO TBCR
40.0000 meq | EXTENDED_RELEASE_TABLET | Freq: Once | ORAL | Status: AC
  Administered 2012-10-08: 40 meq via ORAL
  Filled 2012-10-08: qty 2

## 2012-10-08 MED ORDER — HYOSCYAMINE SULFATE 0.125 MG SL SUBL
0.2500 mg | SUBLINGUAL_TABLET | SUBLINGUAL | Status: DC | PRN
  Administered 2012-10-08: 0.25 mg via SUBLINGUAL
  Filled 2012-10-08 (×3): qty 2

## 2012-10-08 NOTE — Progress Notes (Signed)
[facility] PROGRESS NOTE  Jodi Ballard:[id number] DOB: 1965/12/25 DOA: 10/06/2012 PCP: Crawford Givens, MD  Brief narrative: Jodi Ballard is an 47 y.o. female with a PMH of remote colitis, GERD, but no other significant history who presented to the ER on 10/06/2012 with severe diarrhea, hypokalemia and mesenteric lymphadenopathy noted on CT scan. She has not responded to empiric Cipro/Flagyl and her GI pathogen profile has been normal. GI consultation was requested due to ongoing symptoms and the presence of fecal lactoferrin in a stool specimen.   Assessment/Plan: Principal Problem:  Diarrhea with mesenteric lymphadentitis  -Admitted and placed on empiric IV Cipro/Flagyl.  -Etiology of mesenteric lymphadenitis is usually infectious (eg, Y. enterocolitica, M. tuberculosis, Salmonella species), malignant, or autoimmune. ESR WNL, CRP slightly elevated. GI pathogen profile negative. -Fecal lactoferrin positive. -Continue supportive care with IV fluids. We'll add Lomotil.  -Spoke with Dr. Elnoria Howard gastroenterology, will see the patient in consultation. Active Problems:  GERD  -Continue IV Pepcid.  Hypokalemia  -Replacing in IV fluids. We'll give 40 mEq of oral KCl today.  Code Status: Full.  Family Communication: No family currently at the bedside. Patient identifies mother as emergency contact: [name] [phone].  Disposition Plan: Home when stable.  Medical Consultants:  Dr. Jeani Hawking, gastroenterology.  Other Consultants:  None.  Anti-infectives:  Cipro 10/06/2012--->  Flagyl 10/06/2012--->  HPI/Subjective: Jodi Ballard has had 2 loose stools so far this morning and about 6 yesterday. She continues to complain of upper abdominal cramping. No nausea or vomiting.  Objective: Filed Vitals:   10/07/12 0615 10/07/12 1413 10/07/12 2112 10/08/12 0639  BP: 121/60 140/84 111/68 121/76  Pulse: 98 87 76 87  Temp: 98.1 F (36.7 C) 97.8 F (36.6 C) 98.3 F  (36.8 C) 98.6 F (37 C)  TempSrc: Oral Oral Oral Oral  Resp: 18 18 18 16   Height:      Weight:      SpO2: 98% 97% 100% 99%    Intake/Output Summary (Last 24 hours) at 10/08/12 0934 Last data filed at 10/08/12 0640  Gross per 24 hour  Intake 4041.67 ml  Output      0 ml  Net 4041.67 ml    Exam: Gen:  NAD Cardiovascular:  RRR, No M/R/G Respiratory:  Lungs CTAB Gastrointestinal:  Abdomen soft, NT/ND, + BS Extremities:  No C/E/C  Data Reviewed: Basic Metabolic Panel:  Recent Labs Lab 10/06/12 1146 10/07/12 0952 10/08/12 0745  NA 135 138 137  K 2.7* 3.2* 3.3*  CL 104 110 109  CO2 21 22 23   GLUCOSE 95 89 107*  BUN 7 4* <3*  CREATININE 0.83 0.80 0.76  CALCIUM 8.1* 7.5* 7.6*   GFR Estimated Creatinine Clearance: 99.8 ml/min (by C-G formula based on Cr of 0.76). Liver Function Tests:  Recent Labs Lab 10/06/12 1146  AST 19  ALT 20  ALKPHOS 94  BILITOT 0.4  PROT 7.0  ALBUMIN 3.3*    Recent Labs Lab 10/06/12 1146  LIPASE 27   CBC:  Recent Labs Lab 10/06/12 1146  WBC 5.3  NEUTROABS 2.8  HGB 13.6  HCT 39.9  MCV 86.7  PLT 239   BNP (last 3 results)  Recent Labs  08/23/12 2339  PROBNP 25.7   Microbiology Recent Results (from the past 240 hour(s))  CLOSTRIDIUM DIFFICILE BY PCR     Status: None   Collection Time    10/06/12  3:30 PM      Result Value Range Status   C difficile  by pcr NEGATIVE  NEGATIVE Final     Procedures and Diagnostic Studies: Dg Chest 2 View  10/06/2012   *RADIOLOGY REPORT*  Clinical Data: Upper abdominal pain and diarrhea.  CHEST - 2 VIEW  Comparison: 08/24/2012  Findings: Two views of the chest demonstrate clear lungs.  No focal airspace disease or edema. Heart and mediastinum are within normal limits.  Degenerative changes near the thoracolumbar junction.  IMPRESSION: No acute cardiopulmonary disease.   Original Report Authenticated By: Richarda Overlie, M.D.   Ct Abdomen Pelvis W Contrast  10/06/2012   *RADIOLOGY REPORT*   Clinical Data: Diarrhea  CT ABDOMEN AND PELVIS WITH CONTRAST  Technique:  Multidetector CT imaging of the abdomen and pelvis was performed following the standard protocol during bolus administration of intravenous contrast.  Contrast: 50mL OMNIPAQUE IOHEXOL 300 MG/ML  SOLN, OMNIPAQUE IOHEXOL 300 MG/ML  SOLN  Comparison: None.  Findings: The lung bases are clear.  No pleural effusions.  There is mild diffuse low attenuation within the liver parenchyma suggesting fatty infiltration.  Small low attenuation structure in the right hepatic lobe measures 3 mm, image 18/series 2.  Within the left hepatic lobe there is a 5 mm low attenuation structure, image 26/series 2.  The gallbladder appears normal.  No biliary dilatation.  The pancreas is normal. Normal appearance of the spleen.  The adrenal glands are both normal.  Normal appearance of both kidneys.  The urinary bladder appears unremarkable.  There is an IUD within the uterine cavity.  Normal caliber of the abdominal aorta. Increased number of mesenteric lymph nodes are identified.  There are several the borderline enlarged mesenteric lymph nodes identified.  The largest is in the ileocolic region measuring 1.1 cm, image 48/series 2. There is no enlarged retroperitoneal lymph nodes.  No pelvic or inguinal adenopathy.  There is a small hiatal hernia.  The small bowel loops are mildly increased in caliber containing multiple fluid levels.  These measure up to 2.4 cm.  Enteric contrast material is identified throughout the small bowel and colon up to the level of the rectum.  There is no abnormal small or large bowel wall thickening.  There is no significant free fluid or abnormal fluid collections identified.  No evidence for bowel perforation.  No free air  There is a small right inguinal hernia containing fat only.  Review of the visualized bony structures is significant for mild multilevel degenerative disc disease.  IMPRESSION:  1. Multiple small bowel lymph  nodes are identified.  These are more abnormal in their multiplicity than abnormal in size. Findings may reflect reactive adenopathy or mesenteric adenitis.  2.  Mildly prominent small bowel loops with air-fluid levels.  No evidence for bowel obstruction.  Enteric contrast material is seen throughout the small bowel up to the rectum. 3.  IUD is noted within the uterine cavity. 4.  Low attenuation structures within the liver parenchyma are too small to characterize.   Original Report Authenticated By: Signa Kell, M.D.    Scheduled Meds: . ciprofloxacin  400 mg Intravenous Q12H  . famotidine (PEPCID) IV  20 mg Intravenous Q12H  . metronidazole  500 mg Intravenous Q8H  . multivitamin with minerals  1 tablet Oral Daily   Continuous Infusions: . 0.9 % NaCl with KCl 40 mEq / L 100 mL/hr at 10/08/12 0356    Time spent: 25 minutes.   LOS: 2 days   RAMA,CHRISTINA  [facility] Pager [phone].   *Please note that the hospitalists switch teams  on Wednesdays. Please call the flow manager at [phone] if you are having difficulty reaching the hospitalist taking care of this patient as she can update you and provide the most up-to-date pager number of provider caring for the patient. If 8PM-8AM, please contact night-coverage at [email or web address], password [facility]  10/08/2012, 9:34 AM
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
## 2012-10-09 ENCOUNTER — Encounter (HOSPITAL_COMMUNITY)
Admission: EM | Disposition: A | Discharge status: Home / Self Care | Payer: Self-pay | Source: Home / Self Care | Attending: Internal Medicine

## 2012-10-09 ENCOUNTER — Encounter (HOSPITAL_COMMUNITY): Payer: Self-pay

## 2012-10-09 DIAGNOSIS — R109 Unspecified abdominal pain: Secondary | ICD-10-CM

## 2012-10-09 DIAGNOSIS — K529 Noninfective gastroenteritis and colitis, unspecified: Secondary | ICD-10-CM | POA: Diagnosis present

## 2012-10-09 DIAGNOSIS — K5289 Other specified noninfective gastroenteritis and colitis: Principal | ICD-10-CM

## 2012-10-09 DIAGNOSIS — E876 Hypokalemia: Secondary | ICD-10-CM

## 2012-10-09 HISTORY — PX: COLONOSCOPY: SHX5424

## 2012-10-09 HISTORY — PX: ESOPHAGOGASTRODUODENOSCOPY: SHX5428

## 2012-10-09 LAB — BASIC METABOLIC PANEL
BUN: 3 mg/dL — ABNORMAL LOW (ref 6–23)
Chloride: 109 mEq/L (ref 96–112)
Creatinine, Ser: 0.67 mg/dL (ref 0.50–1.10)
GFR calc Af Amer: >90 mL/min (ref >90)
Glucose, Bld: 105 mg/dL — ABNORMAL HIGH (ref 70–99)

## 2012-10-09 SURGERY — EGD (ESOPHAGOGASTRODUODENOSCOPY)
Anesthesia: Moderate Sedation

## 2012-10-09 MED ORDER — MIDAZOLAM HCL 10 MG/2ML IJ SOLN
INTRAMUSCULAR | Status: AC
  Filled 2012-10-09: qty 4

## 2012-10-09 MED ORDER — LIDOCAINE VISCOUS 2 % MT SOLN
OROMUCOSAL | Status: AC
  Filled 2012-10-09: qty 15

## 2012-10-09 MED ORDER — MIDAZOLAM HCL 10 MG/2ML IJ SOLN
INTRAMUSCULAR | Status: DC | PRN
  Administered 2012-10-09 (×5): 2 mg via INTRAVENOUS

## 2012-10-09 MED ORDER — LIDOCAINE VISCOUS 2 % MT SOLN
OROMUCOSAL | Status: DC | PRN
  Administered 2012-10-09: 15 mL via OROMUCOSAL

## 2012-10-09 MED ORDER — SODIUM CHLORIDE 0.9 % IV SOLN
INTRAVENOUS | Status: DC
  Administered 2012-10-09: 500 mL via INTRAVENOUS
  Administered 2012-10-09: 14:00:00 via INTRAVENOUS

## 2012-10-09 MED ORDER — FENTANYL CITRATE 0.05 MG/ML IJ SOLN
INTRAMUSCULAR | Status: DC | PRN
  Administered 2012-10-09 (×5): 25 ug via INTRAVENOUS

## 2012-10-09 MED ORDER — FENTANYL CITRATE 0.05 MG/ML IJ SOLN
INTRAMUSCULAR | Status: AC
  Filled 2012-10-09: qty 4

## 2012-10-09 NOTE — Op Note (Signed)
[facility] [address] [place], [address]   OPERATIVE PROCEDURE REPORT  PATIENT :Jodi Ballard, Jodi Ballard  MR#: [id number] BIRTHDATE :06-30-1965 GENDER: Female ENDOSCOPIST: Dr.  Lorenza Burton, MD ASSISTANT:   Kandice Robinsons, technician Jimmey Ralph, RN, CGRN PROCEDURE DATE: 2012-10-29 PRE-PROCEDURE PREPERATION: Patient fasted for 4 hours prior to procedure. PRE-PROCEDURE PHYSICAL: Patient has stable vital signs.  Neck is supple.  There is no JVD, thyromegaly or LAD.  Chest clear to auscultation.  S1 and S2 regular.  Abdomen soft, morbidly obese, non-distended, non-tender with NABS. PROCEDURE:     EGD w/ biopsy ASA CLASS:     Class II INDICATIONS:     Unexplained diarrhea. MEDICATIONS:     Fentanyl 100 mcg IV and Versed 6 mg IV TOPICAL ANESTHETIC:   none given.  DESCRIPTION OF PROCEDURE:   After the risks benefits and alternatives of the procedure were thoroughly explained, informed consent was obtained.  The Pentax Gastroscope [id number]  was introduced through the mouth and advanced to the second portion of the duodenum , without limitations. The instrument was slowly withdrawn as the mucosa was fully examined.   Except for patchy gastritis, tThe esophagus, stomach and the proximal small bowel appeared normal. There were no ulcers, erosions, masses or polyps noted. Retroflexed views revealed a large hiatal hernia. Small bowel biopsies were done to rule out sprue. The scope was then withdrawn from the patient and the procedure terminated. The patient tolerated the procedure without immediate complications.  IMPRESSION:  Patchy gastritis with a large hiatal hernia; small bowel biospeis done to rule out sprue.  RECOMMENDATIONS:     1.  Await biopsy results. 2.  Avoid NSAIDS for two weeks. 3.  Anti-reflux regimen to be followed. 4.  Proceed with colonoscopy.   REPEAT EXAM:  for No recall planned for now.  DISCHARGE INSTRUCTIONS: standard discharge  instructions given. _______________________________ eSigned:  Dr. Lorenza Burton, MD Oct 29, 2012 4:54 PM   CPT CODES:     [phone], EGD with biopsy  DIAGNOSIS CODES:   CC:  PATIENT NAME:  Jodi Ballard, Jodi Ballard MR#: [id number]

## 2012-10-09 NOTE — Progress Notes (Addendum)
[facility] PROGRESS NOTE  Jodi Ballard ZOX:[id number] DOB: 02-15-1966 DOA: 10/06/2012 PCP: Jodi Givens, MD  Brief narrative: 47 year old female with past medical history of colitis, GERD who presented to [facility] ED 10/06/2012 with complaints of severe diarrhea and abdominal pain. Patient was noted to have hypokalemia on admission as well as mesenteric lymphadenopathy identified on CAT scan. Patient continues to have diarrhea even while on Cipro and Flagyl. GI pathology is within normal limits. GI was consulted and the plan is for EGD and colonoscopy with biopsy today.  Assessment/Plan:   Principal Problem:  Diarrhea with mesenteric lymphadentitis  - Continue ciprofloxacin and Flagyl - Patient reports not being in significant amount of pain today. I have discontinued the morphine and oxycodone. She reported having severe intolerance to both medications and prefers not to have to is not needed. - As mentioned above GI pathology within normal limits. Stool lactoferrin positive. -  Appreciate GI consult and their recommendation. Plan for endoscopy and colonoscopy today.  Active Problems:  GERD  - Continue IV Pepcid 20 mg Q 12 hours Hypokalemia  - Replacing in IV fluids. Potassium now within normal limits  Code Status: Full.  Family Communication: No family currently at the bedside. Patient identifies mother as emergency contact: Jodi Ballard [phone].  Disposition Plan: Home when stable.   Medical Consultants:  Dr. Jeani Hawking, Gastroenterology. Procedures:  EGD and colonoscopy today. Anti-infectives:  Cipro 10/06/2012--->  Flagyl 10/06/2012--->  Manson Passey, MD  [facility] Pager [phone]  If 7PM-7AM, please contact night-coverage [email or web address] Password [email or web address] 10/09/2012, 2:11 PM   LOS: 3 days    HPI/Subjective: No acute overnight events.  Objective: Filed Vitals:   10/08/12 1458 10/08/12 2138 10/09/12 0607 10/09/12 0639  BP: 150/75 138/89 90/57 108/62  Pulse: 86 83 73  76  Temp: 97.4 F (36.3 C) 97.7 F (36.5 C) 98.3 F (36.8 C)   TempSrc: Oral Oral Oral   Resp: 18 16 16    Height:      Weight:      SpO2: 100% 99% 99%     Intake/Output Summary (Last 24 hours) at 10/09/12 1411 Last data filed at 10/09/12 0842  Gross per 24 hour  Intake   6540 ml  Output      0 ml  Net   6540 ml    Exam:   General:  Pt is alert, follows commands appropriately, not in acute distress  Cardiovascular: Regular rate and rhythm, S1/S2, no murmurs, no rubs, no gallops  Respiratory: Clear to auscultation bilaterally, no wheezing, no crackles, no rhonchi  Abdomen: Soft, non tender, non distended, bowel sounds present, no guarding  Extremities: No edema, pulses DP and PT palpable bilaterally  Neuro: Grossly nonfocal  Data Reviewed: Basic Metabolic Panel:  Recent Labs Lab 10/06/12 1146 10/07/12 0952 10/08/12 0745 10/09/12 0348  NA 135 138 137 141  K 2.7* 3.2* 3.3* 3.5  CL 104 110 109 109  CO2 21 22 23 23   GLUCOSE 95 89 107* 105*  BUN 7 4* <3* 3*  CREATININE 0.83 0.80 0.76 0.67  CALCIUM 8.1* 7.5* 7.6* 7.5*   Liver Function Tests:  Recent Labs Lab 10/06/12 1146  AST 19  ALT 20  ALKPHOS 94  BILITOT 0.4  PROT 7.0  ALBUMIN 3.3*    Recent Labs Lab 10/06/12 1146  LIPASE 27   No results found for this basename: AMMONIA,  in the last 168 hours CBC:  Recent Labs Lab 10/06/12 1146  WBC 5.3  NEUTROABS 2.8  HGB 13.6  HCT 39.9  MCV 86.7  PLT 239   CLOSTRIDIUM DIFFICILE BY PCR     Status: None   Collection Time    10/06/12  3:30 PM      Result Value Range Status   C difficile by pcr NEGATIVE  NEGATIVE Final     Studies: No results found.  Scheduled Meds: . ciprofloxacin  400 mg Intravenous Q12H  . famotidine (PEPCID) IV  20 mg Intravenous Q12H  . metronidazole  500 mg Intravenous Q8H  . multivitamin   1 tablet Oral Daily   Continuous Infusions: . 0.9 % NaCl with KCl 40 mEq / L 100 mL/hr at 10/09/12 1155

## 2012-10-09 NOTE — Op Note (Addendum)
[facility] [address] [place], [address]   COLONOSCOPY PROCEDURE REPORT  PATIENT: Jodi Ballard, Jodi Ballard  MR#: [id number] BIRTHDATE: 01/17/1966 , 47  yrs. old GENDER: Female ENDOSCOPIST: Dr.  Lorenza Burton, MD REFERRED BY: PROCEDURE DATE:  10/09/2012 PROCEDURE PREP: The patient was prepped with a gallon of Nulytely for the procedure and was fasted for 4 hours prior to the procedure. PROCEDURE:   Colonoscopy with multiple cold biopsies. ASA CLASS:   Class II INDICATIONS: Change in bowel habits [unexplaned diarrhea]; CRC screening.Marland Kitchen MEDICATIONS: Fentanyl 25 mcg IV and Versed 2 mg IV  DESCRIPTION OF PROCEDURE:   After the risks benefits and alternatives of the procedure were thoroughly explained, informed consent was obtained.  A digital rectal exam revealed no rectal mass.   The Pentax video colonocsope [phone])  endoscope was introduced through the anus and advanced to the cecum, which was identified by the ileocecal valve. No adverse events experienced. The quality of the prep was poor, using Nulytley  The instrument was then slowly withdrawn as the colon was fully examined.      COLON FINDINGS: There was a large amount of solid stool in the colon; the areas of the colonic mucosa that were identified, appeared normal. Random colonic biopsies were done to rule out microscopic colitis. These ad colonic mucosa appeared normal. Retroflexed views revealed no abnormalities. Due to the large amount of stool in the colon, visualization was not adequate. The time to cecum=6 minutes 0 seconds.  Withdrawal time=8 minutes 0 seconds.  The scope was withdrawn and the procedure completed. COMPLICATIONS: There were no complications.  ENDOSCOPIC IMPRESSION: Poor prep with a large amount of solid stool in the colon, especially on the right side. Random biopsies done; cecal base not visualized.  RECOMMENDATIONS: 1.  Await pathology results. 2.  Avoid all NSAIDS for  the next 2 weeks. 3.  Repeat colonoscopy at a later date with a 5 day Golytely prep.  [id number] 787.99, V76.51 eSigned:  Dr. Lorenza Burton, MD 10/09/2012 5:31 PM Revised: 10/09/2012 5:31 PM  y]   PATIENT NAME:  Jodi Ballard, Jodi Ballard MR#: [id number]

## 2012-10-10 ENCOUNTER — Encounter (HOSPITAL_COMMUNITY): Payer: Self-pay | Admitting: Gastroenterology

## 2012-10-10 DIAGNOSIS — E860 Dehydration: Secondary | ICD-10-CM

## 2012-10-10 MED ORDER — METRONIDAZOLE 500 MG PO TABS: 500.0000 mg | ORAL_TABLET | Freq: Three times a day (TID) | ORAL | Status: DC

## 2012-10-10 MED ORDER — CIPROFLOXACIN HCL 500 MG PO TABS: 500.0000 mg | ORAL_TABLET | Freq: Two times a day (BID) | ORAL | Status: DC

## 2012-10-10 MED ORDER — DIPHENOXYLATE-ATROPINE 2.5-0.025 MG PO TABS: 2.0000 | ORAL_TABLET | Freq: Four times a day (QID) | ORAL | Status: DC | PRN

## 2012-10-10 MED ORDER — HYOSCYAMINE SULFATE 0.125 MG SL SUBL: 0.2500 mg | SUBLINGUAL_TABLET | SUBLINGUAL | Status: DC | PRN

## 2012-10-10 NOTE — Progress Notes (Signed)
Pt D/C home, alert and oriented, room air denies pain, pt is stable. D?c instructions given. Pt verbalizes understanding

## 2012-10-10 NOTE — Discharge Summary (Signed)
Physician Discharge Summary  Jodi Ballard JYN:[id number] DOB: [birth year] DOA: 10/06/2012  PCP: Crawford Givens, MD  Admit date: 10/06/2012 Discharge date: 10/10/2012  Recommendations for Outpatient Follow-up:  1. Please follow up with GI in 2 weeks from discharge to follow up pathology results of endoscopy 2. Continue cipro and flagyl for 5 more days on discharge 3. Please make sure to continue high fiber diet to regulate bowel movements; ideally 25 gm of fiber a day. 4. Please note that the patient wanted to minimize the medications she is taking and she reported she will take over-the-counter medication if she needs it for acid reflux.  Discharge Diagnoses:  Principal Problem:   Diarrhea Active Problems:   Colitis   GERD   Mesenteric lymphadenitis   Hypokalemia   Discharge Condition: Medically stable for discharge home today  Diet recommendation: As tolerated  History of present illness:  47 year old female with past medical history of colitis, GERD who presented to [facility] ED 10/06/2012 with complaints of severe diarrhea and abdominal pain. Patient was noted to have hypokalemia on admission as well as mesenteric lymphadenopathy identified on CAT scan. Patient continues to have diarrhea even while on Cipro and Flagyl. GI pathology is within normal limits. Patient is status post EGD and colonoscopy 10/09/2012.  Assessment/Plan:   Principal Problem:  Diarrhea with mesenteric lymphadentitis  - Continue ciprofloxacin and Flagyl for additional 5 days on discharge. This will complete a total of 10 days of antibiotics. - Patient reports not being in significant amount of pain so pain medications are not indicated at the time of discharge. - As mentioned above GI pathology within normal limits. Stool lactoferrin positive.  - Endoscopy findings were significant for gastritis and colonoscopy was not successful due to inadequate preparation. Colonoscopy will need to be repeated in  future. Active Problems:  GERD  - Patient was on IV Pepcid 20 mg every 12 hours.  Hypokalemia  - Potassium within normal limits  Code Status: Full.  Family Communication: No family currently at the bedside. Patient identifies mother as emergency contact: [name] [phone].    Medical Consultants:  Dr. Jeani Hawking, Gastroenterology. Procedures:  EGD and colonoscopy 10/09/2012 Anti-infectives:  Cipro 10/06/2012---> for 5 days on discharge Flagyl 10/06/2012---> for 5 days on discharge   Manson Passey, MD  [facility]  Pager [phone]   Discharge Exam: Filed Vitals:   10/10/12 0500  BP: 120/78  Pulse: 76  Temp: 98.3 F (36.8 C)  Resp: 16   Filed Vitals:   10/09/12 1718 10/09/12 1728 10/09/12 2050 10/10/12 0500  BP: 111/71 111/80 122/76 120/78  Pulse:   81 76  Temp:   97.7 F (36.5 C) 98.3 F (36.8 C)  TempSrc:   Oral Oral  Resp: 15 11 16 16   Height:      Weight:      SpO2: 99% 97% 100% 98%    General: Pt is alert, follows commands appropriately, not in acute distress Cardiovascular: Regular rate and rhythm, S1/S2 +, no murmurs, no rubs, no gallops Respiratory: Clear to auscultation bilaterally, no wheezing, no crackles, no rhonchi Abdominal: Soft, non tender, non distended, bowel sounds +, no guarding Extremities: no edema, no cyanosis, pulses palpable bilaterally DP and PT Neuro: Grossly nonfocal  Discharge Instructions  Discharge Orders   Future Orders Complete By Expires     Call MD for:  difficulty breathing, headache or visual disturbances  As directed     Call MD for:  persistant dizziness or light-headedness  As  directed     Call MD for:  persistant nausea and vomiting  As directed     Call MD for:  severe uncontrolled pain  As directed     Diet - low sodium heart healthy  As directed     Discharge instructions  As directed     Comments:      1. Please follow up with GI in 2 weeks from discharge to follow up pathology results of endoscopy 2. Continue  cipro and flagyl for 5 more days on discharge 3. Please make sure to continue high fiber diet to regulate bowel movements; ideally 25 gm of fiber a day.    Increase activity slowly  As directed         Medication List         ciprofloxacin 500 MG tablet  Commonly known as:  CIPRO  Take 1 tablet (500 mg total) by mouth 2 (two) times daily.     diphenoxylate-atropine 2.5-0.025 MG per tablet  Commonly known as:  LOMOTIL  Take 2 tablets by mouth 4 (four) times daily as needed for diarrhea or loose stools.     hyoscyamine 0.125 MG SL tablet  Commonly known as:  LEVSIN SL  Place 2 tablets (0.25 mg total) under the tongue every 4 (four) hours as needed for cramping.     loperamide 2 MG capsule  Commonly known as:  IMODIUM  Take 2 mg by mouth 4 (four) times daily as needed for diarrhea or loose stools.     metroNIDAZOLE 500 MG tablet  Commonly known as:  FLAGYL  Take 1 tablet (500 mg total) by mouth 3 (three) times daily.     multivitamin with minerals Tabs  Take 1 tablet by mouth daily.           Follow-up Information   Follow up with Crawford Givens, MD In 2 weeks.   Contact information:   [address] [place] [address] [phone]       Follow up with Theda Belfast, MD. Schedule an appointment as soon as possible for a visit in 2 weeks.   Contact information:   [address] [place] [address] [phone]        The results of significant diagnostics from this hospitalization (including imaging, microbiology, ancillary and laboratory) are listed below for reference.    Significant Diagnostic Studies: Dg Chest 2 View  10/06/2012   *RADIOLOGY REPORT*  Clinical Data: Upper abdominal pain and diarrhea.  CHEST - 2 VIEW  Comparison: 08/24/2012  Findings: Two views of the chest demonstrate clear lungs.  No focal airspace disease or edema. Heart and mediastinum are within normal limits.  Degenerative changes near the thoracolumbar junction.   IMPRESSION: No acute cardiopulmonary disease.   Original Report Authenticated By: Richarda Overlie, M.D.   Ct Abdomen Pelvis W Contrast  10/06/2012   *RADIOLOGY REPORT*  Clinical Data: Diarrhea  CT ABDOMEN AND PELVIS WITH CONTRAST  Technique:  Multidetector CT imaging of the abdomen and pelvis was performed following the standard protocol during bolus administration of intravenous contrast.  Contrast: 50mL OMNIPAQUE IOHEXOL 300 MG/ML  SOLN, OMNIPAQUE IOHEXOL 300 MG/ML  SOLN  Comparison: None.  Findings: The lung bases are clear.  No pleural effusions.  There is mild diffuse low attenuation within the liver parenchyma suggesting fatty infiltration.  Small low attenuation structure in the right hepatic lobe measures 3 mm, image 18/series 2.  Within the left hepatic lobe there is a 5  mm low attenuation structure, image 26/series 2.  The gallbladder appears normal.  No biliary dilatation.  The pancreas is normal. Normal appearance of the spleen.  The adrenal glands are both normal.  Normal appearance of both kidneys.  The urinary bladder appears unremarkable.  There is an IUD within the uterine cavity.  Normal caliber of the abdominal aorta. Increased number of mesenteric lymph nodes are identified.  There are several the borderline enlarged mesenteric lymph nodes identified.  The largest is in the ileocolic region measuring 1.1 cm, image 48/series 2. There is no enlarged retroperitoneal lymph nodes.  No pelvic or inguinal adenopathy.  There is a small hiatal hernia.  The small bowel loops are mildly increased in caliber containing multiple fluid levels.  These measure up to 2.4 cm.  Enteric contrast material is identified throughout the small bowel and colon up to the level of the rectum.  There is no abnormal small or large bowel wall thickening.  There is no significant free fluid or abnormal fluid collections identified.  No evidence for bowel perforation.  No free air  There is a small right inguinal hernia  containing fat only.  Review of the visualized bony structures is significant for mild multilevel degenerative disc disease.  IMPRESSION:  1. Multiple small bowel lymph nodes are identified.  These are more abnormal in their multiplicity than abnormal in size. Findings may reflect reactive adenopathy or mesenteric adenitis.  2.  Mildly prominent small bowel loops with air-fluid levels.  No evidence for bowel obstruction.  Enteric contrast material is seen throughout the small bowel up to the rectum. 3.  IUD is noted within the uterine cavity. 4.  Low attenuation structures within the liver parenchyma are too small to characterize.   Original Report Authenticated By: Signa Kell, M.D.    Microbiology: Recent Results (from the past 240 hour(s))  CLOSTRIDIUM DIFFICILE BY PCR     Status: None   Collection Time    10/06/12  3:30 PM      Result Value Range Status   C difficile by pcr NEGATIVE  NEGATIVE Final     Labs: Basic Metabolic Panel:  Recent Labs Lab 10/06/12 1146 10/07/12 0952 10/08/12 0745 10/09/12 0348  NA 135 138 137 141  K 2.7* 3.2* 3.3* 3.5  CL 104 110 109 109  CO2 21 22 23 23   GLUCOSE 95 89 107* 105*  BUN 7 4* <3* 3*  CREATININE 0.83 0.80 0.76 0.67  CALCIUM 8.1* 7.5* 7.6* 7.5*   Liver Function Tests:  Recent Labs Lab 10/06/12 1146  AST 19  ALT 20  ALKPHOS 94  BILITOT 0.4  PROT 7.0  ALBUMIN 3.3*    Recent Labs Lab 10/06/12 1146  LIPASE 27   No results found for this basename: AMMONIA,  in the last 168 hours CBC:  Recent Labs Lab 10/06/12 1146  WBC 5.3  NEUTROABS 2.8  HGB 13.6  HCT 39.9  MCV 86.7  PLT 239   Cardiac Enzymes: No results found for this basename: CKTOTAL, CKMB, CKMBINDEX, TROPONINI,  in the last 168 hours BNP: BNP (last 3 results)  Recent Labs  08/23/12 2339  PROBNP 25.7   CBG: No results found for this basename: GLUCAP,  in the last 168 hours  Time coordinating discharge: Over 30 minutes  Signed:  Manson Passey,  MD  [facility]  10/10/2012, 2:17 PM  Pager #: [phone]

## 2012-10-10 NOTE — Progress Notes (Signed)
Subjective: One very small bowel movement this AM.  Overall feeling well.  Objective: Vital signs in last 24 hours: Temp:  [97.7 F (36.5 C)-98.4 F (36.9 C)] 98.3 F (36.8 C) (07/10 0500) Pulse Rate:  [76-107] 76 (07/10 0500) Resp:  [5-26] 16 (07/10 0500) BP: (95-165)/(17-126) 120/78 mmHg (07/10 0500) SpO2:  [97 %-100 %] 98 % (07/10 0500) Last BM Date: 10/09/12  Intake/Output from previous day: 07/09 0701 - 07/10 0700 In: 740 [P.O.:240; I.V.:500] Out: -  Intake/Output this shift:    General appearance: alert and no distress GI: soft, non-tender; bowel sounds normal; no masses,  no organomegaly  Lab Results: No results found for this basename: WBC, HGB, HCT, PLT,  in the last 72 hours BMET  Recent Labs  10/08/12 0745 10/09/12 0348  NA 137 141  K 3.3* 3.5  CL 109 109  CO2 23 23  GLUCOSE 107* 105*  BUN <3* 3*  CREATININE 0.76 0.67  CALCIUM 7.6* 7.5*   LFT No results found for this basename: PROT, ALBUMIN, AST, ALT, ALKPHOS, BILITOT, BILIDIR, IBILI,  in the last 72 hours PT/INR No results found for this basename: LABPROT, INR,  in the last 72 hours Hepatitis Panel No results found for this basename: HEPBSAG, HCVAB, HEPAIGM, HEPBIGM,  in the last 72 hours C-Diff No results found for this basename: CDIFFTOX,  in the last 72 hours Fecal Lactopherrin No results found for this basename: FECLLACTOFRN,  in the last 72 hours  Studies/Results: No results found.  Medications:  Scheduled: . ciprofloxacin  400 mg Intravenous Q12H  . famotidine (PEPCID) IV  20 mg Intravenous Q12H  . metronidazole  500 mg Intravenous Q8H  . multivitamin with minerals  1 tablet Oral Daily   Continuous:   Assessment/Plan: 1) Diarrhea. 2) Bloating.   The diarrhea appears to have resolved at this time, but she still complains of bloating.  This may resolve in time, but it is too soon to determine.  Plan: 1) Okay to D/C home.   2) Follow up in 2 weeks in the office. 3) I will call  the patient with her pathology results.   LOS: 4 days   Ludivina Guymon D 10/10/2012, 2:05 PM

## 2013-06-23 LAB — HM PAP SMEAR

## 2013-06-26 ENCOUNTER — Encounter: Payer: Self-pay | Admitting: General Surgery

## 2013-06-26 DIAGNOSIS — R079 Chest pain, unspecified: Secondary | ICD-10-CM

## 2013-06-26 DIAGNOSIS — R000 Tachycardia, unspecified: Secondary | ICD-10-CM | POA: Insufficient documentation

## 2013-07-17 ENCOUNTER — Ambulatory Visit (INDEPENDENT_AMBULATORY_CARE_PROVIDER_SITE_OTHER): Payer: BC Managed Care – PPO | Admitting: Cardiology

## 2013-07-17 ENCOUNTER — Encounter (INDEPENDENT_AMBULATORY_CARE_PROVIDER_SITE_OTHER): Payer: BC Managed Care – PPO

## 2013-07-17 ENCOUNTER — Encounter: Payer: Self-pay | Admitting: Cardiology

## 2013-07-17 ENCOUNTER — Encounter: Payer: Self-pay | Admitting: Radiology

## 2013-07-17 VITALS — BP 135/89 | HR 100 | Ht 62.0 in | Wt 258.0 lb

## 2013-07-17 DIAGNOSIS — I471 Supraventricular tachycardia, unspecified: Secondary | ICD-10-CM | POA: Diagnosis not present

## 2013-07-17 DIAGNOSIS — I479 Paroxysmal tachycardia, unspecified: Secondary | ICD-10-CM

## 2013-07-17 DIAGNOSIS — R0602 Shortness of breath: Secondary | ICD-10-CM | POA: Diagnosis not present

## 2013-07-17 DIAGNOSIS — R000 Tachycardia, unspecified: Secondary | ICD-10-CM

## 2013-07-17 DIAGNOSIS — R0789 Other chest pain: Secondary | ICD-10-CM | POA: Diagnosis not present

## 2013-07-17 MED ORDER — METOPROLOL SUCCINATE ER 25 MG PO TB24: 25.0000 mg | ORAL_TABLET | Freq: Every day | ORAL | Status: DC

## 2013-07-17 NOTE — Progress Notes (Signed)
[address] [place], [place]  [address] Phone: [phone]) [phone] Fax:  [phone]  Date:  07/17/2013   ID:  Jodi MetzLorie N Heyde, DOB 02/16/1966, MRN [id number]  PCP:  Crawford GivensGraham Duncan, MD  Cardiologist:  Armanda Magicraci Turner, MD     History of Present Illness: Jodi Ballard is a 48 y.o. female with a history of GERD presents today for evaluation of tachycardia. She apparently was giving plasma and was noted to have an elevated HR and the plasmaphoresis was stopped.  She says that she has a family history of elevated HR.  She was seen recently by Dr. Billy Coastaavon and was noted to have a HR of 115bpm. She occasionally has chest pain and SOB.  She says that the chest pain is midsternal to left sided and feels like a sharp stabbing pain.  The SOB is mainly when she exerts herself.  She denies any SOB, nausea or diaphoresis with the chest pain.  She occasionally has some LE edema which has increased in frequency and severity lately.  She sits for long periods of time for her work.  She tried to avoid added salt.  She denies any palpitations.    Wt Readings from Last 3 Encounters:  07/17/13 258 lb (117.028 kg)  10/06/12 239 lb 6.4 oz (108.591 kg)  10/06/12 239 lb 6.4 oz (108.591 kg)     Past Medical History  Diagnosis Date  . GERD (gastroesophageal reflux disease)   . Lumbar disc disease     controlled with exercises  . IUD     Mirena  . Colitis   . Seizures     None since age [age].  Marland Kitchen. Complication of anesthesia     nausea/vommiting    Current Outpatient Prescriptions  Medication Sig Dispense Refill  . B Complex Vitamins (VITAMIN B COMPLEX PO) Take by mouth as directed.      . Multiple Vitamin (MULTIVITAMIN WITH MINERALS) TABS Take 1 tablet by mouth daily.      . NON FORMULARY Take by mouth daily. FEMINENE      . NON FORMULARY Take by mouth daily. OPC 3---POWDER MIXED IN WATER       No current facility-administered medications for this visit.    Allergies:    Allergies  Allergen  Reactions  . Tetanus Toxoid     REACTION: anaphylactic shock  . Codeine     REACTION: n/ v    Social History:  The patient  reports that she quit smoking about 20 years ago. Her smoking use included Cigarettes. She smoked 0.00 packs per day. She does not have any smokeless tobacco history on file. She reports that she drinks alcohol. She reports that she does not use illicit drugs.   Family History:  The patient's family history includes Alzheimer's disease in her other; Cancer in her brother, father, other, and paternal grandfather; Colon polyps in her mother; Heart disease in her father and paternal grandmother; Hyperlipidemia in her brother; Hypertension in her mother; Macular degeneration in her mother; Obesity in her mother; Osteoporosis in her mother; Stroke in her father. There is no history of Alcohol abuse, Drug abuse, or Depression.   ROS:  Please see the history of present illness.      All other systems reviewed and negative.   PHYSICAL EXAM: VS:  BP 135/89  Pulse 100  Ht 5\' 2"  (1.575 m)  Wt 258 lb (117.028 kg)  BMI 47.18 kg/m2 Well nourished, well developed, in no acute distress HEENT:  normal Neck: no JVD Cardiac:  normal S1, S2; RRR; no murmur Lungs:  clear to auscultation bilaterally, no wheezing, rhonchi or rales Abd: soft, nontender, no hepatomegaly Ext: no edema Skin: warm and dry Neuro:  CNs 2-12 intact, no focal abnormalities noted  EKG:     NSR with paroxysmal atrial tachycardia HR 90-100bpm  ASSESSMENT AND PLAN:  1.  Paroxysmal ectopic atrial Tachycardia - she had a recent TSH at Dr. Jorene Minorsaavon's office that is pending.  Her EKG today show paroxysms of atrial tachycardia intermittently with NSR.  When in atrial tach her HR is around 100-110bpm.   - I will start her on Toprol XL 25mg  daily to suppress EAT since maintaining a HR above 100-110bpm could lead long term to tachycardia induced CM - I will get an event monitor to assess how much ectopic atrial tachycardia  she is having 2.  SOB most likely related to obesity - 2D echo to assess LVF 3.  Atypical chest pain that does not sound cardiac in nature but she has risk factors including remote tobacco history, obesity and family history of MI in her Dad at age 49 and CVA. - ETT to rule out ischemia  Followup with me in 4 weeks  Signed, Armanda Magicraci Turner, MD 07/17/2013 10:27 AM

## 2013-07-17 NOTE — Patient Instructions (Addendum)
Your physician has recommended you make the following change in your medication:  1. Start Toprol XL 25 mg 1 tab daily  Your physician has recommended that you wear an event monitor. Event monitors are medical devices that record the heart's electrical activity. Doctors most often us these monitors to diagnose arrhythmias. Arrhythmias are problems with the speed or rhythm of the heartbeat. The monitor is a small, portable device. You can wear one while you do your normal daily activities. This is usually used to diagnose what is causing palpitations/syncope (passing out).  Your physician has requested that you have an exercise tolerance test. For further information please visit [email or web address]. Please also follow instruction sheet, as given.   Your physician has requested that you have an echocardiogram. Echocardiography is a painless test that uses sound waves to create images of your heart. It provides your doctor with information about the size and shape of your heart and how well your heart's chambers and valves are working. This procedure takes approximately one hour. There are no restrictions for this procedure.  Your physician recommends that you schedule a follow-up appointment in: 4 weeks with Dr. Mayford Knifeurner

## 2013-07-17 NOTE — Progress Notes (Signed)
Patient ID: Jodi MetzLorie N Ballard, female   DOB: May 09, 1965, 48 y.o.   MRN: [id number] [employer] 30 day monitor applied

## 2013-08-20 ENCOUNTER — Other Ambulatory Visit: Payer: Self-pay | Admitting: General Surgery

## 2013-08-20 ENCOUNTER — Telehealth: Payer: Self-pay | Admitting: Cardiology

## 2013-08-20 MED ORDER — METOPROLOL SUCCINATE ER 50 MG PO TB24: 50.0000 mg | ORAL_TABLET | Freq: Every day | ORAL | Status: DC

## 2013-08-20 NOTE — Telephone Encounter (Signed)
Please let patient know that heart monitor showed very frequent fast heart beats from the top of the heart consistent with ectopic atrial tachycardia (junctional tachycardia).  This has persisted on heart monitor despite addition of Toprol.  Please increase Toprol to 50mg  daily and have her set up to see Dr. Lewayne BuntingGregg Taylor to determined possible need for ablation.

## 2013-08-20 NOTE — Telephone Encounter (Signed)
Pt is aware. New Rx called in for pt and pt is aware she will be set up with appt with EP

## 2013-08-20 NOTE — Telephone Encounter (Signed)
Pt is aware and new med called into pharmacy

## 2013-08-20 NOTE — Telephone Encounter (Signed)
LVM for pt to return call

## 2013-08-21 ENCOUNTER — Ambulatory Visit (HOSPITAL_COMMUNITY): Payer: BC Managed Care – PPO | Attending: Cardiovascular Disease | Admitting: Radiology

## 2013-08-21 ENCOUNTER — Ambulatory Visit (INDEPENDENT_AMBULATORY_CARE_PROVIDER_SITE_OTHER): Payer: BC Managed Care – PPO | Admitting: Nurse Practitioner

## 2013-08-21 ENCOUNTER — Other Ambulatory Visit: Payer: Self-pay | Admitting: General Surgery

## 2013-08-21 ENCOUNTER — Encounter: Payer: Self-pay | Admitting: Nurse Practitioner

## 2013-08-21 ENCOUNTER — Telehealth: Payer: Self-pay | Admitting: Cardiology

## 2013-08-21 VITALS — BP 114/80 | HR 116

## 2013-08-21 DIAGNOSIS — I471 Supraventricular tachycardia, unspecified: Secondary | ICD-10-CM | POA: Insufficient documentation

## 2013-08-21 DIAGNOSIS — R0789 Other chest pain: Secondary | ICD-10-CM

## 2013-08-21 DIAGNOSIS — I4719 Other supraventricular tachycardia: Secondary | ICD-10-CM

## 2013-08-21 DIAGNOSIS — R079 Chest pain, unspecified: Secondary | ICD-10-CM

## 2013-08-21 DIAGNOSIS — R0602 Shortness of breath: Secondary | ICD-10-CM

## 2013-08-21 NOTE — Telephone Encounter (Signed)
Please let her know that she can cancel my appt and just see Dr. Ladona Ridgelaylor.  We will call her with the results of her studies

## 2013-08-21 NOTE — Progress Notes (Signed)
Echocardiogram performed.  

## 2013-08-21 NOTE — Telephone Encounter (Signed)
TO Dr Mayford Knifeurner to advise  Pt is in office and wants answer before leaving

## 2013-08-21 NOTE — Telephone Encounter (Signed)
Pt is aware.  

## 2013-08-21 NOTE — Telephone Encounter (Signed)
Patient has an appointment with Dr. Mayford Knifeurner on 09/02/13; she thinks to discuss test results of echo and ett she is having today.  She also has an appointment with Dr. Ladona Ridgelaylor on 09/11/13 to discuss possible ablation.  She wants to know if she has to keep both appointments or if she can see Ladona Ridgelaylor and let him go over the results instead.  Please call.

## 2013-08-21 NOTE — Progress Notes (Signed)
Exercise Treadmill Test  Pre-Exercise Testing Evaluation Rhythm: sinus tachycardia  Rate: 100 bpm     Test  Exercise Tolerance Test Ordering MD: Armanda Magicraci Turner, MD  Interpreting MD: Norma FredricksonLori Gerhardt, NP  Unique Test No: 1  Treadmill:  1  Indication for ETT: exertional dyspnea  Contraindication to ETT: No   Stress Modality: exercise - treadmill  Cardiac Imaging Performed: non   Protocol: standard Bruce - maximal  Max BP:  169/83  Max MPHR (bpm):  171 85% MPR (bpm):  145  MPHR obtained (bpm):  150 % MPHR obtained:  87%  Reached 85% MPHR (min:sec):  6:15 Total Exercise Time (min-sec):  6:30  Workload in METS:  7.7 Borg Scale: 15  Reason ETT Terminated:  patient's desire to stop    ST Segment Analysis At Rest: normal ST segments - no evidence of significant ST depression With Exercise: no evidence of significant ST depression  Other Information Arrhythmia:  No Angina during ETT:  absent (0) Quality of ETT:  diagnostic  ETT Interpretation:  normal - no evidence of ischemia by ST analysis  Comments: Patient presents today for routine GXT. Has had chest pain, shortness of breath and palpitations. Had a holter and has been referred to EP for ablation.   Today the patient exercised on the standard Bruce protocol for a total of 6:30 minutes.  Reduced exercise tolerance.  Adequate blood pressure response.  Clinically negative for chest pain. Test was stopped due to dyspnea - no chest pain - target HR achieved.  EKG negative for ischemia. No significant arrhythmia noted.   Recommendations: CV risk factor modification.  Proceed on with EP evaluation.  Patient is agreeable to this plan and will call if any problems develop in the interim.   Rosalio MacadamiaLori C. Gerhardt, RN, ANP-C [facility] [address] [place], [place]  [address] [phone]

## 2013-09-02 ENCOUNTER — Ambulatory Visit: Payer: BC Managed Care – PPO | Admitting: Cardiology

## 2013-09-11 ENCOUNTER — Ambulatory Visit (INDEPENDENT_AMBULATORY_CARE_PROVIDER_SITE_OTHER): Payer: BC Managed Care – PPO | Admitting: Internal Medicine

## 2013-09-11 ENCOUNTER — Encounter: Payer: Self-pay | Admitting: Internal Medicine

## 2013-09-11 VITALS — BP 144/84 | HR 91 | Ht 62.0 in | Wt 258.2 lb

## 2013-09-11 DIAGNOSIS — I471 Supraventricular tachycardia: Secondary | ICD-10-CM

## 2013-09-11 MED ORDER — METOPROLOL SUCCINATE ER 25 MG PO TB24: 25.0000 mg | ORAL_TABLET | Freq: Every day | ORAL | Status: DC

## 2013-09-11 NOTE — Assessment & Plan Note (Signed)
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 She is clear that her arrhythmias are not bothering her and she has not symptoms and does not want " anything done." Because of fatigue on the beta blocker, I have asked the patient to reduce her dose. She is minimally symptomatic, she will undergo watcful waiting. I have given her warning signs to call for if her heart racing worsens or she develops tachycardia meditated cardiomyopathy.

## 2013-09-11 NOTE — Patient Instructions (Addendum)
Your physician wants you to follow-up in: 6 months with Dr Court Joy will receive a reminder letter in the mail two months in advance. If you don't receive a letter, please call our office to schedule the follow-up appointment.   Your physician has recommended you make the following change in your medication:  1) Decrease Metoprolol to 25mg  daily
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
## 2014-03-14 ENCOUNTER — Emergency Department (HOSPITAL_COMMUNITY): Payer: No Typology Code available for payment source

## 2014-03-14 ENCOUNTER — Emergency Department (HOSPITAL_COMMUNITY)
Admission: EM | Admit: 2014-03-14 | Discharge: 2014-03-14 | Disposition: A | Discharge status: Home / Self Care | Payer: No Typology Code available for payment source | Attending: Emergency Medicine | Admitting: Emergency Medicine

## 2014-03-14 ENCOUNTER — Encounter (HOSPITAL_COMMUNITY): Payer: Self-pay | Admitting: Emergency Medicine

## 2014-03-14 DIAGNOSIS — Z8719 Personal history of other diseases of the digestive system: Secondary | ICD-10-CM | POA: Insufficient documentation

## 2014-03-14 DIAGNOSIS — W1830XA Fall on same level, unspecified, initial encounter: Secondary | ICD-10-CM | POA: Diagnosis not present

## 2014-03-14 DIAGNOSIS — Y9389 Activity, other specified: Secondary | ICD-10-CM | POA: Insufficient documentation

## 2014-03-14 DIAGNOSIS — Z87891 Personal history of nicotine dependence: Secondary | ICD-10-CM | POA: Diagnosis not present

## 2014-03-14 DIAGNOSIS — Y998 Other external cause status: Secondary | ICD-10-CM | POA: Diagnosis not present

## 2014-03-14 DIAGNOSIS — M5431 Sciatica, right side: Secondary | ICD-10-CM | POA: Insufficient documentation

## 2014-03-14 DIAGNOSIS — S3992XA Unspecified injury of lower back, initial encounter: Secondary | ICD-10-CM | POA: Insufficient documentation

## 2014-03-14 DIAGNOSIS — Z8669 Personal history of other diseases of the nervous system and sense organs: Secondary | ICD-10-CM | POA: Diagnosis not present

## 2014-03-14 DIAGNOSIS — W19XXXA Unspecified fall, initial encounter: Secondary | ICD-10-CM

## 2014-03-14 DIAGNOSIS — Z79899 Other long term (current) drug therapy: Secondary | ICD-10-CM | POA: Diagnosis not present

## 2014-03-14 DIAGNOSIS — Y9289 Other specified places as the place of occurrence of the external cause: Secondary | ICD-10-CM | POA: Diagnosis not present

## 2014-03-14 MED ORDER — OXYCODONE-ACETAMINOPHEN 5-325 MG PO TABS
1.0000 | ORAL_TABLET | Freq: Once | ORAL | Status: AC
  Administered 2014-03-14: 1 via ORAL
  Filled 2014-03-14: qty 1

## 2014-03-14 MED ORDER — CYCLOBENZAPRINE HCL 5 MG PO TABS: 5.0000 mg | ORAL_TABLET | Freq: Three times a day (TID) | ORAL | Status: DC | PRN

## 2014-03-14 MED ORDER — CYCLOBENZAPRINE HCL 10 MG PO TABS
5.0000 mg | ORAL_TABLET | Freq: Once | ORAL | Status: AC
  Administered 2014-03-14: 5 mg via ORAL
  Filled 2014-03-14: qty 1

## 2014-03-14 MED ORDER — OXYCODONE-ACETAMINOPHEN 5-325 MG PO TABS: 1.0000 | ORAL_TABLET | Freq: Three times a day (TID) | ORAL | Status: DC | PRN

## 2014-03-14 NOTE — Discharge Instructions (Signed)
Back Pain, Adult °Back pain is very common. The pain often gets better over time. The cause of back pain is usually not dangerous. Most people can learn to manage their back pain on their own.  °HOME CARE  °· Stay active. Start with short walks on flat ground if you can. Try to walk farther each day. °· Do not sit, drive, or stand in one place for more than 30 minutes. Do not stay in bed. °· Do not avoid exercise or work. Activity can help your back heal faster. °· Be careful when you bend or lift an object. Bend at your knees, keep the object close to you, and do not twist. °· Sleep on a firm mattress. Lie on your side, and bend your knees. If you lie on your back, put a pillow under your knees. °· Only take medicines as told by your doctor. °· Put ice on the injured area. °¨ Put ice in a plastic bag. °¨ Place a towel between your skin and the bag. °¨ Leave the ice on for 15-20 minutes, 03-04 times a day for the first 2 to 3 days. After that, you can switch between ice and heat packs. °· Ask your doctor about back exercises or massage. °· Avoid feeling anxious or stressed. Find good ways to deal with stress, such as exercise. °GET HELP RIGHT AWAY IF:  °· Your pain does not go away with rest or medicine. °· Your pain does not go away in 1 week. °· You have new problems. °· You do not feel well. °· The pain spreads into your legs. °· You cannot control when you poop (bowel movement) or pee (urinate). °· Your arms or legs feel weak or lose feeling (numbness). °· You feel sick to your stomach (nauseous) or throw up (vomit). °· You have belly (abdominal) pain. °· You feel like you may pass out (faint). °MAKE SURE YOU:  °· Understand these instructions. °· Will watch your condition. °· Will get help right away if you are not doing well or get worse. °Document Released: 09/06/2007 Document Revised: 06/12/2011 Document Reviewed: 07/22/2013 °[employer]® Patient Information ©2015 [employer]. This information is not intended  to replace advice given to you by your health care provider. Make sure you discuss any questions you have with your health care provider. ° °

## 2014-03-14 NOTE — ED Notes (Signed)
Pt from home c/o lower back pain that radiates down right leg. She reports burning sensation when muscle spasm occurs. She has been taking 800 mg of motrin
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
## 2014-03-14 NOTE — ED Notes (Signed)
Patient transported to X-ray 

## 2016-01-10 ENCOUNTER — Ambulatory Visit (INDEPENDENT_AMBULATORY_CARE_PROVIDER_SITE_OTHER): Payer: 59 | Admitting: Family Medicine

## 2016-01-10 ENCOUNTER — Encounter: Payer: Self-pay | Admitting: Family Medicine

## 2016-01-10 VITALS — BP 126/96 | HR 114 | Temp 98.8°F | Wt 283.0 lb

## 2016-01-10 DIAGNOSIS — K219 Gastro-esophageal reflux disease without esophagitis: Secondary | ICD-10-CM | POA: Diagnosis not present

## 2016-01-10 DIAGNOSIS — I471 Supraventricular tachycardia: Secondary | ICD-10-CM

## 2016-01-10 DIAGNOSIS — I10 Essential (primary) hypertension: Secondary | ICD-10-CM | POA: Diagnosis not present

## 2016-01-10 MED ORDER — RANITIDINE HCL 150 MG PO TABS: 150.0000 mg | ORAL_TABLET | Freq: Two times a day (BID) | ORAL | 3 refills | Status: DC

## 2016-01-10 MED ORDER — METOPROLOL SUCCINATE ER 25 MG PO TB24: 25.0000 mg | ORAL_TABLET | Freq: Every day | ORAL | 3 refills | Status: DC

## 2016-01-10 NOTE — Patient Instructions (Addendum)
Go to the lab on the way out.  We'll contact you with your lab report. Pick one thing to work on first: think about how to get a lunch at work or about getting some exercise (walking).  You can try zantac as needed for heartburn.  Restart metoprolol.  Update me in about 1 week.  Take care.  Glad to see you.

## 2016-01-10 NOTE — Progress Notes (Signed)
Pre visit review using our clinic review tool, if applicable. No additional management support is needed unless otherwise documented below in the visit note.  History of elevated blood pressure and tachycardia. Not on metoprolol currently. Previous had cardiac eval. No chest pain. She admits to being deconditioned. She had blood pressure elevations on multiple checks so she came in for evaluation. She gained weight when she was stress eating and caring for her mother, who has died in the interval.  Heartburn. More bothersome for patient in the meantime. Her weight is up. Discussed with patient about diet and exercise. She is not on any medicines currently. Not vomiting blood.  PMH and SH reviewed  ROS: Per HPI unless specifically indicated in ROS section   Meds, vitals, and allergies reviewed.   GEN: nad, alert and oriented HEENT: mucous membranes moist NECK: supple w/o LA CV: slightly tachy but regular.  no murmur PULM: ctab, no inc wob ABD: soft, +bs EXT: no edema SKIN: no acute rash

## 2016-01-11 LAB — TSH: TSH: 1.19 u[IU]/mL (ref 0.35–4.50)

## 2016-01-11 LAB — COMPREHENSIVE METABOLIC PANEL
ALT: 22 U/L (ref 0–35)
AST: 20 U/L (ref 0–37)
Albumin: 4.2 g/dL (ref 3.5–5.2)
Alkaline Phosphatase: 104 U/L (ref 39–117)
BUN: 13 mg/dL (ref 6–23)
CALCIUM: 10.3 mg/dL (ref 8.4–10.5)
CHLORIDE: 102 meq/L (ref 96–112)
CO2: 28 meq/L (ref 19–32)
CREATININE: 0.93 mg/dL (ref 0.40–1.20)
GFR: 67.44 mL/min (ref >60.00)
Glucose, Bld: 85 mg/dL (ref 70–99)
Potassium: 4.3 mEq/L (ref 3.5–5.1)
Sodium: 140 mEq/L (ref 135–145)
Total Bilirubin: 0.4 mg/dL (ref 0.2–1.2)
Total Protein: 7.7 g/dL (ref 6.0–8.3)

## 2016-01-11 LAB — CBC WITH DIFFERENTIAL/PLATELET
BASOS PCT: 0.3 % (ref 0.0–3.0)
Basophils Absolute: 0 10*3/uL (ref 0.0–0.1)
EOS ABS: 0.3 10*3/uL (ref 0.0–0.7)
Eosinophils Relative: 3.5 % (ref 0.0–5.0)
HEMATOCRIT: 43.5 % (ref 36.0–46.0)
Hemoglobin: 14.6 g/dL (ref 12.0–15.0)
LYMPHS PCT: 21.8 % (ref 12.0–46.0)
Lymphs Abs: 2.1 10*3/uL (ref 0.7–4.0)
MCHC: 33.6 g/dL (ref 30.0–36.0)
MCV: 86.5 fl (ref 78.0–100.0)
MONOS PCT: 5.6 % (ref 3.0–12.0)
Monocytes Absolute: 0.5 10*3/uL (ref 0.1–1.0)
NEUTROS ABS: 6.6 10*3/uL (ref 1.4–7.7)
Neutrophils Relative %: 68.8 % (ref 43.0–77.0)
PLATELETS: 283 10*3/uL (ref 150.0–400.0)
RBC: 5.03 Mil/uL (ref 3.87–5.11)
RDW: 14 % (ref 11.5–15.5)
WBC: 9.6 10*3/uL (ref 4.0–10.5)

## 2016-01-12 ENCOUNTER — Encounter: Payer: Self-pay | Admitting: Family Medicine

## 2016-01-12 DIAGNOSIS — I10 Essential (primary) hypertension: Secondary | ICD-10-CM | POA: Insufficient documentation

## 2016-01-12 NOTE — Assessment & Plan Note (Signed)
Presumed that she still has atrial tachycardia. She has slight elevation in heart rate at time of exam today. She still sounds to be regular. Would restart metoprolol and she will update me as needed. At this point she appears okay for outpatient follow-up. Check routine labs.

## 2016-01-12 NOTE — Assessment & Plan Note (Signed)
Discussed with patient about diet and exercise. Start ranitidine. She will update me as needed.

## 2016-01-12 NOTE — Assessment & Plan Note (Signed)
Discussed with patient about diet and exercise. Restart metoprolol. She will update me about her blood pressure in the near future. Check routine labs today.

## 2016-01-16 ENCOUNTER — Encounter: Payer: Self-pay | Admitting: Family Medicine

## 2016-05-06 ENCOUNTER — Other Ambulatory Visit: Payer: Self-pay | Admitting: Family Medicine

## 2016-05-06 DIAGNOSIS — I471 Supraventricular tachycardia: Secondary | ICD-10-CM

## 2016-07-02 ENCOUNTER — Emergency Department (HOSPITAL_COMMUNITY): Payer: 59

## 2016-07-02 ENCOUNTER — Encounter (HOSPITAL_COMMUNITY): Payer: Self-pay | Admitting: Emergency Medicine

## 2016-07-02 ENCOUNTER — Emergency Department (HOSPITAL_COMMUNITY)
Admission: EM | Admit: 2016-07-02 | Discharge: 2016-07-02 | Disposition: A | Discharge status: Home / Self Care | Payer: 59 | Attending: Emergency Medicine | Admitting: Emergency Medicine

## 2016-07-02 DIAGNOSIS — M545 Low back pain, unspecified: Secondary | ICD-10-CM

## 2016-07-02 DIAGNOSIS — S40811A Abrasion of right upper arm, initial encounter: Secondary | ICD-10-CM | POA: Insufficient documentation

## 2016-07-02 DIAGNOSIS — Y999 Unspecified external cause status: Secondary | ICD-10-CM | POA: Insufficient documentation

## 2016-07-02 DIAGNOSIS — I10 Essential (primary) hypertension: Secondary | ICD-10-CM | POA: Insufficient documentation

## 2016-07-02 DIAGNOSIS — Z87891 Personal history of nicotine dependence: Secondary | ICD-10-CM | POA: Diagnosis not present

## 2016-07-02 DIAGNOSIS — Y9389 Activity, other specified: Secondary | ICD-10-CM | POA: Diagnosis not present

## 2016-07-02 DIAGNOSIS — R935 Abnormal findings on diagnostic imaging of other abdominal regions, including retroperitoneum: Secondary | ICD-10-CM | POA: Diagnosis not present

## 2016-07-02 DIAGNOSIS — Y92410 Unspecified street and highway as the place of occurrence of the external cause: Secondary | ICD-10-CM | POA: Insufficient documentation

## 2016-07-02 DIAGNOSIS — M25552 Pain in left hip: Secondary | ICD-10-CM

## 2016-07-02 DIAGNOSIS — Z79899 Other long term (current) drug therapy: Secondary | ICD-10-CM | POA: Insufficient documentation

## 2016-07-02 DIAGNOSIS — S4991XA Unspecified injury of right shoulder and upper arm, initial encounter: Secondary | ICD-10-CM | POA: Diagnosis present

## 2016-07-02 LAB — URINALYSIS, ROUTINE W REFLEX MICROSCOPIC
Bilirubin Urine: NEGATIVE
GLUCOSE, UA: NEGATIVE mg/dL
Ketones, ur: NEGATIVE mg/dL
NITRITE: NEGATIVE
PH: 5 (ref 5.0–8.0)
Protein, ur: NEGATIVE mg/dL
SPECIFIC GRAVITY, URINE: 1.02 (ref 1.005–1.030)

## 2016-07-02 LAB — BASIC METABOLIC PANEL
ANION GAP: 7 (ref 5–15)
BUN: 18 mg/dL (ref 6–20)
CHLORIDE: 107 mmol/L (ref 101–111)
CO2: 24 mmol/L (ref 22–32)
Calcium: 9.2 mg/dL (ref 8.9–10.3)
Creatinine, Ser: 0.96 mg/dL (ref 0.44–1.00)
GFR calc non Af Amer: >60 mL/min (ref >60)
Glucose, Bld: 113 mg/dL — ABNORMAL HIGH (ref 65–99)
Potassium: 4.4 mmol/L (ref 3.5–5.1)
SODIUM: 138 mmol/L (ref 135–145)

## 2016-07-02 LAB — CBC WITH DIFFERENTIAL/PLATELET
Basophils Absolute: 0.1 10*3/uL (ref 0.0–0.1)
Basophils Relative: 0 %
Eosinophils Absolute: 0.2 10*3/uL (ref 0.0–0.7)
Eosinophils Relative: 2 %
HCT: 40.5 % (ref 36.0–46.0)
HEMOGLOBIN: 13.1 g/dL (ref 12.0–15.0)
LYMPHS ABS: 2 10*3/uL (ref 0.7–4.0)
LYMPHS PCT: 17 %
MCH: 28.7 pg (ref 26.0–34.0)
MCHC: 32.3 g/dL (ref 30.0–36.0)
MCV: 88.6 fL (ref 78.0–100.0)
Monocytes Absolute: 0.8 10*3/uL (ref 0.1–1.0)
Monocytes Relative: 7 %
NEUTROS ABS: 8.5 10*3/uL — AB (ref 1.7–7.7)
Neutrophils Relative %: 74 %
PLATELETS: 271 10*3/uL (ref 150–400)
RBC: 4.57 MIL/uL (ref 3.87–5.11)
RDW: 13.7 % (ref 11.5–15.5)
WBC: 11.5 10*3/uL — AB (ref 4.0–10.5)

## 2016-07-02 MED ORDER — ORPHENADRINE CITRATE ER 100 MG PO TB12: 100.0000 mg | ORAL_TABLET | Freq: Two times a day (BID) | ORAL | 0 refills | Status: DC

## 2016-07-02 MED ORDER — ACETAMINOPHEN 500 MG PO TABS
1000.0000 mg | ORAL_TABLET | Freq: Once | ORAL | Status: AC
  Administered 2016-07-02: 1000 mg via ORAL
  Filled 2016-07-02: qty 2

## 2016-07-02 MED ORDER — IOPAMIDOL (ISOVUE-300) INJECTION 61%
INTRAVENOUS | Status: AC
  Administered 2016-07-02: 100 mL via INTRAVENOUS
  Filled 2016-07-02: qty 100

## 2016-07-02 MED ORDER — IBUPROFEN 800 MG PO TABS: 800.0000 mg | ORAL_TABLET | Freq: Three times a day (TID) | ORAL | 0 refills | Status: DC
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
## 2016-07-07 ENCOUNTER — Encounter: Payer: Self-pay | Admitting: Family Medicine

## 2016-07-07 ENCOUNTER — Ambulatory Visit (INDEPENDENT_AMBULATORY_CARE_PROVIDER_SITE_OTHER): Payer: 59 | Admitting: Family Medicine

## 2016-07-07 DIAGNOSIS — S060X0D Concussion without loss of consciousness, subsequent encounter: Secondary | ICD-10-CM | POA: Diagnosis not present

## 2016-07-07 MED ORDER — IBUPROFEN 800 MG PO TABS: 800.0000 mg | ORAL_TABLET | Freq: Two times a day (BID) | ORAL | Status: DC | PRN

## 2016-07-07 MED ORDER — ORPHENADRINE CITRATE ER 100 MG PO TB12: 100.0000 mg | ORAL_TABLET | Freq: Every evening | ORAL | Status: DC | PRN

## 2016-07-07 NOTE — Progress Notes (Signed)
Pre visit review using our clinic review tool, if applicable. No additional management support is needed unless otherwise documented below in the visit note. 

## 2016-07-07 NOTE — Patient Instructions (Signed)
You can try heat or ice, whichever helps more.  Gently stretch your back and legs.  Brain rest in the meantime.  Take care.  Glad to see you.  Update me as needed.
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
## 2016-07-09 DIAGNOSIS — S060XAA Concussion with loss of consciousness status unknown, initial encounter: Secondary | ICD-10-CM | POA: Insufficient documentation

## 2016-07-09 DIAGNOSIS — S060X9A Concussion with loss of consciousness of unspecified duration, initial encounter: Secondary | ICD-10-CM | POA: Insufficient documentation

## 2016-07-09 NOTE — Assessment & Plan Note (Signed)
Presumed concussion.   Discussed with patient. She needs "brain rest ". Postconcussive symptoms discussed with patient. Her headache and vertigo are better.  Advised rest and gradual return to activity and mentally stimulating activity. If she has trouble with concentration at work then I want her to let me know. Discussed with patient about helmet use. Fortunately she has no obvious residual neurologic symptoms.  The musculoskeletal issues on her left buttock appeared to be benign and should resolve. Discussed with patient about supportive care. See after visit summary. They mild superficial abrasion should resolve.  >25 minutes spent in face to face time with patient, >50% spent in counselling or coordination of care.

## 2016-08-21 ENCOUNTER — Other Ambulatory Visit: Payer: Self-pay | Admitting: *Deleted

## 2016-08-21 DIAGNOSIS — I471 Supraventricular tachycardia: Secondary | ICD-10-CM

## 2016-08-21 DIAGNOSIS — I4719 Other supraventricular tachycardia: Secondary | ICD-10-CM

## 2016-08-21 MED ORDER — METOPROLOL SUCCINATE ER 25 MG PO TB24: 25.0000 mg | ORAL_TABLET | Freq: Every day | ORAL | 0 refills | Status: DC

## 2016-08-21 NOTE — Telephone Encounter (Signed)
Pharmacy requesting 90D supply 

## 2017-08-17 ENCOUNTER — Ambulatory Visit: Payer: Self-pay | Admitting: *Deleted

## 2017-08-17 ENCOUNTER — Telehealth: Payer: Self-pay | Admitting: Family Medicine

## 2017-08-17 NOTE — Telephone Encounter (Signed)
Copied from CRM [id number]. Topic: Quick Communication - See Telephone Encounter >> Aug 17, 2017  9:25 AM Herby Abraham C wrote: CRM for notification. See Telephone encounter for: 08/17/17.  Pt called in and requested a nurse call her back. Pt  is having hemorrhoid problems. Pt says that she has been having some bleeding since Wednesday. She would like to be advised on what she should do?   CB: [phone] (home)

## 2017-08-17 NOTE — Telephone Encounter (Signed)
Left message to call back to discuss symptoms.

## 2017-08-17 NOTE — Telephone Encounter (Signed)
Pt called back and states that the GYN gave her some medication that may have caused constipation; she states that she had not been eating a lot but she has been drinking a lot; and was not able to have to have a bowel movement; however taking dulcolax stool softner has helpe ; she states that she says she has fresh red blood when she wipes; she has tried wipes with witch hazel, preparation H cooling gel, and soaked in epsom salt; recommendations made per nurse triage to include seeing a physician within 24 hours; no providers at [facility] have availability within the parameters set per nurse triage protocol; pt declined offer to be evaluated at another [place] office; she states that she will "ride it out this weekend" and requests to be seen by Dr Para March on Tuesday 08/21/17; pt offered and accepted appointment with Dr Para March 08/21/17 at 1030; she verbalizes understanding; will route to office for notification of this encounter and upcoming appointment. Reason for Disposition . MODERATE-SEVERE rectal pain (i.e., interferes with school, work, or sleep)  Answer Assessment - Initial Assessment Questions 1. SYMPTOM:  "What's the main symptom you're concerned about?" (e.g., pain, itching, swelling, rash)     Pain from hemorrhooids 2. ONSET: "When did the ________  start?"     Wed 08/15/17 3. RECTAL PAIN: "Do you have any pain around your rectum?" "How bad is the pain?"  (Scale 1-10; or mild, moderate, severe)  - MILD (1-3): doesn't interfere with normal activities   - MODERATE (4-7): interferes with normal activities or awakens from sleep, limping   - SEVERE (8-10): excruciating pain, unable to have a bowel movement      severe 4. RECTAL ITCHING: "Do you have any itching in this area?" "How bad is the itching?"  (Scale 1-10; or mild, moderate, severe)  - MILD - doesn't interfere with normal activities   - MODERATE-SEVERE: interferes with normal activities or awakens from sleep     no 5. CONSTIPATION:  "Do you have constipation?" If so, "How bad is it?"     Yes; took stool softener 6. CAUSE: "What do you think is causing the anus symptoms?"     hemorrhoids 7. OTHER SYMPTOMS: "Do you have any other symptoms?"  (e.g., rectal bleeding, abdominal pain, vomiting, fever)     Fresh red blood when she wipes 8. PREGNANCY: "Is there any chance you are pregnant?" "When was your last menstrual period?"     No LMP February 2019; mirena iud  Protocols used: RECTAL [facility]

## 2017-08-17 NOTE — Telephone Encounter (Signed)
All of that sounds reasonable.  Will as scheduled.  Thanks.

## 2017-08-21 ENCOUNTER — Ambulatory Visit: Payer: Managed Care, Other (non HMO) | Admitting: Family Medicine

## 2017-08-21 ENCOUNTER — Encounter: Payer: Self-pay | Admitting: Family Medicine

## 2017-08-21 DIAGNOSIS — K644 Residual hemorrhoidal skin tags: Secondary | ICD-10-CM

## 2017-08-21 DIAGNOSIS — I471 Supraventricular tachycardia: Secondary | ICD-10-CM

## 2017-08-21 DIAGNOSIS — I4719 Other supraventricular tachycardia: Secondary | ICD-10-CM

## 2017-08-21 MED ORDER — DOCUSATE SODIUM 100 MG PO CAPS: 100.0000 mg | ORAL_CAPSULE | Freq: Every day | ORAL | Status: DC | PRN

## 2017-08-21 MED ORDER — METOPROLOL SUCCINATE ER 25 MG PO TB24: 12.5000 mg | ORAL_TABLET | Freq: Every day | ORAL | Status: DC

## 2017-08-21 NOTE — Progress Notes (Signed)
Symptoms started last week.  She had sig rectal pain with BMs prev.  She had some bleeding prev but better now unless she strains with BMs, then sees a few drops blood.  No abd pain.  She isn't vomiting blood.  No fevers.  She feels well otherwise.  She cut back to 1/2 tab of metoprolol due to fatigue.    Meds, vitals, and allergies reviewed.   ROS: Per HPI unless specifically indicated in ROS section   nad ncat abd soft rrr Chaperoned exam with old resolving external hemorrhoid noted.  No gross blood.  No rectal fissure seen.

## 2017-08-21 NOTE — Patient Instructions (Signed)
Add on colace daily and avoid straining.  Use topical hydrocortisone cream if needed for irritation.  If you continue to have troubles then let us know.  Take care.  Glad to see you.

## 2017-08-22 DIAGNOSIS — K644 Residual hemorrhoidal skin tags: Secondary | ICD-10-CM | POA: Insufficient documentation

## 2017-08-22 NOTE — Assessment & Plan Note (Signed)
Add on colace daily and avoid straining.  Use topical hydrocortisone cream if needed for irritation. Update me as needed.  Anatomy and rationale discussed with patient.  She agrees.

## 2017-08-22 NOTE — Assessment & Plan Note (Signed)
She cut her beta-blocker back to the fatigue but has been able to tolerate that.  Continue as is.

## 2017-09-13 ENCOUNTER — Other Ambulatory Visit: Payer: Self-pay | Admitting: Family Medicine

## 2017-09-13 ENCOUNTER — Other Ambulatory Visit: Payer: Self-pay | Admitting: *Deleted

## 2017-09-13 DIAGNOSIS — I471 Supraventricular tachycardia: Secondary | ICD-10-CM

## 2017-09-13 DIAGNOSIS — I4719 Other supraventricular tachycardia: Secondary | ICD-10-CM

## 2017-09-13 MED ORDER — METOPROLOL SUCCINATE ER 25 MG PO TB24: 12.5000 mg | ORAL_TABLET | Freq: Every day | ORAL | 1 refills | Status: DC

## 2017-11-27 ENCOUNTER — Ambulatory Visit: Payer: Managed Care, Other (non HMO) | Admitting: Internal Medicine

## 2017-11-27 ENCOUNTER — Encounter: Payer: Self-pay | Admitting: Internal Medicine

## 2017-11-27 VITALS — BP 138/86 | HR 110 | Temp 98.3°F | Wt 289.0 lb

## 2017-11-27 DIAGNOSIS — R0981 Nasal congestion: Secondary | ICD-10-CM

## 2017-11-27 DIAGNOSIS — J029 Acute pharyngitis, unspecified: Secondary | ICD-10-CM

## 2017-11-27 DIAGNOSIS — J020 Streptococcal pharyngitis: Secondary | ICD-10-CM

## 2017-11-27 LAB — POCT RAPID STREP A (OFFICE): Rapid Strep A Screen: POSITIVE — AB

## 2017-11-27 MED ORDER — AZITHROMYCIN 250 MG PO TABS: ORAL_TABLET | ORAL | 0 refills | Status: DC

## 2017-11-27 MED ORDER — METHYLPREDNISOLONE ACETATE 80 MG/ML IJ SUSP
80.0000 mg | Freq: Once | INTRAMUSCULAR | Status: AC
  Administered 2017-11-27: 80 mg via INTRAMUSCULAR

## 2017-11-27 NOTE — Addendum Note (Signed)
Addended by: Roena MaladyEVONTENNO, MELANIE Y on: 11/27/2017 12:20 PM   Modules accepted: Orders

## 2017-11-27 NOTE — Patient Instructions (Signed)

## 2017-11-27 NOTE — Progress Notes (Signed)
HPI  Pt presents to the clinic today with c/o nasal congestion and sore throat. She reports this started 4 days ago. She is not blowing anything out of her nose. She denies difficulty swallowing. She denies runny nose, ear pain or cough. She denies fever, chills or body aches. She has tried Zyrtec, Mucinex, salt walter gargles and leftover Amoxil with minimal relief. She reports she has had sick contacts diagnosed with strep.  Review of Systems      Past Medical History:  Diagnosis Date  . Colitis   . Complication of anesthesia    nausea/vommiting  . GERD (gastroesophageal reflux disease)   . IUD    Mirena  . Lumbar disc disease    controlled with exercises  . Seizures (HCC)    None since age 27-15.  . Tachycardia     Family History  Problem Relation Age of Onset  . Hypertension Mother   . Osteoporosis Mother   . Obesity Mother   . Macular degeneration Mother   . Colon polyps Mother        Divertics  . Cancer Mother        bladder cancer  . Cancer Father        Lung  . Stroke Father   . Heart disease Father   . Hyperlipidemia Brother   . Cancer Brother        Skin  . Heart disease Paternal Grandmother        MI x 2  . Cancer Paternal Grandfather        Prostate  . Cancer Other        Breast CA strong on maternal side  . Alzheimer's disease Other        Both sides of family  . Alcohol abuse Neg Hx   . Drug abuse Neg Hx   . Depression Neg Hx     Social History   Socioeconomic History  . Marital status: Legally Separated    Spouse name: Not on file  . Number of children: 3  . Years of education: Not on file  . Highest education level: Not on file  Occupational History  . 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 Financial resource strain: Not on file  . Food insecurity:    Worry: Not on file    Inability: Not on file  . Transportation needs:    Medical: Not on file   Non-medical: Not on file  Tobacco Use  . Smoking status: Former Smoker    Types: Cigarettes    Last attempt to quit: 04/03/1993    Years since quitting: 24.6  . Smokeless tobacco: Never Used  Substance and Sexual Activity  . Alcohol use: Yes    Comment: occasionally  . Drug use: No  . Sexual activity: Not on file  Lifestyle  . Physical activity:    Days per week: Not on file    Minutes per session: Not on file  . Stress: Not on file  Relationships  . Social connections:    Talks on phone: Not on file    Gets together: Not on file    Attends religious service: Not on file    Active member of club or organization: Not on file    Attends meetings of clubs or organizations: Not on file    Relationship status: Not on file  . Intimate partner violence:    Fear of  current or ex partner: Not on file    Emotionally abused: Not on file    Physically abused: Not on file    Forced sexual activity: Not on file  Other Topics Concern  . Not on file  Social History Narrative   Married in 1999, separated as of August 2011.     Children:  2 biological, 1 adopted   Enjoys times with two grandsons    Allergies  Allergen Reactions  . Tetanus Toxoid     REACTION: anaphylactic shock  . Codeine     REACTION: n/ v  . Eggs Or Egg-Derived Products     History egg allergy in childhood.  Now she can eat eggs w/o troubles except for photosensitivity after eating eggs     Constitutional: Positive headache, fatigue and fever. Denies abrupt weight changes.  HEENT:  Positive sore throat. Denies eye redness, eye pain, pressure behind the eyes, facial pain, nasal congestion, ear pain, ringing in the ears, wax buildup, runny nose or bloody nose. Respiratory: Positive cough. Denies difficulty breathing or shortness of breath.  Cardiovascular: Denies chest pain, chest tightness, palpitations or swelling in the hands or feet.   No other specific complaints in a complete review of systems (except as  listed in HPI above).  Objective:   BP 138/86   Pulse (!) 110   Temp 98.3 F (36.8 C) (Oral)   Wt 289 lb (131.1 kg)   SpO2 98%   BMI 52.86 kg/m  Wt Readings from Last 3 Encounters:  11/27/17 289 lb (131.1 kg)  08/21/17 294 lb (133.4 kg)  07/07/16 297 lb 8 oz (134.9 kg)     General: Appears her stated age, in NAD. HEENT: Head: normal shape and size, no sinus tenderness noted; Ears: Tm's gray and intact, normal light reflex; Nose: mucosa boggy and moist, turbinates; Throat/Mouth: Teeth present, mucosa erythematous and moist, no exudate noted, no lesions or ulcerations noted.  Neck: No cervical lymphadenopathy.  Cardiovascular: Tachycardic with normal rhythm. S1,S2 noted.  No murmur, rubs or gallops noted.  Pulmonary/Chest: Normal effort and positive vesicular breath sounds. No respiratory distress. No wheezes, rales or ronchi noted.       Assessment & Plan:   Nasal Congestion, Sore Throat, secondary toStrep Throat:  RST: positive Get some rest and drink plenty of water Do salt water gargles for the sore throat eRx for Azithromax x 5 days 80 mg Depo IM today  RTC as needed or if symptoms persist.   Jodi Reaperegina Kaycie Pegues, NP

## 2017-12-12 ENCOUNTER — Encounter: Payer: Self-pay | Admitting: Internal Medicine

## 2017-12-12 ENCOUNTER — Ambulatory Visit: Payer: Managed Care, Other (non HMO) | Admitting: Internal Medicine

## 2017-12-12 VITALS — BP 132/86 | HR 102 | Temp 98.1°F | Wt 290.0 lb

## 2017-12-12 DIAGNOSIS — J029 Acute pharyngitis, unspecified: Secondary | ICD-10-CM | POA: Diagnosis not present

## 2017-12-12 DIAGNOSIS — R0981 Nasal congestion: Secondary | ICD-10-CM | POA: Diagnosis not present

## 2017-12-12 DIAGNOSIS — R05 Cough: Secondary | ICD-10-CM

## 2017-12-12 DIAGNOSIS — R059 Cough, unspecified: Secondary | ICD-10-CM

## 2017-12-12 DIAGNOSIS — J309 Allergic rhinitis, unspecified: Secondary | ICD-10-CM | POA: Diagnosis not present

## 2017-12-12 LAB — POCT RAPID STREP A (OFFICE): Rapid Strep A Screen: NEGATIVE

## 2017-12-12 MED ORDER — PROMETHAZINE-DM 6.25-15 MG/5ML PO SYRP: 5.0000 mL | ORAL_SOLUTION | Freq: Every evening | ORAL | 0 refills | Status: DC | PRN

## 2017-12-12 MED ORDER — BENZONATATE 200 MG PO CAPS: 200.0000 mg | ORAL_CAPSULE | Freq: Three times a day (TID) | ORAL | 0 refills | Status: DC | PRN

## 2017-12-12 NOTE — Progress Notes (Signed)
HPI  Pt presents to the clinic today with c/o nasal congestion, sore throat and cough. She reports this started 4-5 days ago. She is not blowing anything out of her nose.  She denies difficulty swallowing. The cough is productive of yellow/brown mucous. She denies fever, chills or body aches. She has tried Mucinex with minimal relief. She was seen 8/27, diagnosed with strep throat and treated with Azithromycin. She has no history of allergies. She has had sick contacts.  Review of Systems      Past Medical History:  Diagnosis Date  . Colitis   . Complication of anesthesia    nausea/vommiting  . GERD (gastroesophageal reflux disease)   . IUD    Mirena  . Lumbar disc disease    controlled with exercises  . Seizures (HCC)    None since age 18-15.  . Tachycardia     Family History  Problem Relation Age of Onset  . Hypertension Mother   . Osteoporosis Mother   . Obesity Mother   . Macular degeneration Mother   . Colon polyps Mother        Divertics  . Cancer Mother        bladder cancer  . Cancer Father        Lung  . Stroke Father   . Heart disease Father   . Hyperlipidemia Brother   . Cancer Brother        Skin  . Heart disease Paternal Grandmother        MI x 2  . Cancer Paternal Grandfather        Prostate  . Cancer Other        Breast CA strong on maternal side  . Alzheimer's disease Other        Both sides of family  . Alcohol abuse Neg Hx   . Drug abuse Neg Hx   . Depression Neg Hx     Social History   Socioeconomic History  . Marital status: Legally Separated    Spouse name: Not on file  . Number of children: 3  . Years of education: Not on file  . Highest education level: Not on file  Occupational History  . 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 Financial resource strain: Not on file  . Food insecurity:    Worry: Not on file    Inability: Not on file  . Transportation  needs:    Medical: Not on file    Non-medical: Not on file  Tobacco Use  . Smoking status: Former Smoker    Types: Cigarettes    Last attempt to quit: 04/03/1993    Years since quitting: 24.7  . Smokeless tobacco: Never Used  Substance and Sexual Activity  . Alcohol use: Yes    Comment: occasionally  . Drug use: No  . Sexual activity: Not on file  Lifestyle  . Physical activity:    Days per week: Not on file    Minutes per session: Not on file  . Stress: Not on file  Relationships  . Social connections:    Talks on phone: Not on file    Gets together: Not on file    Attends religious service: Not on file    Active member of club or organization: Not on file    Attends meetings of clubs or organizations: Not on file    Relationship status: Not on file  .  Intimate partner violence:    Fear of current or ex partner: Not on file    Emotionally abused: Not on file    Physically abused: Not on file    Forced sexual activity: Not on file  Other Topics Concern  . Not on file  Social History Narrative   Married in 1999, separated as of August 2011.     Children:  2 biological, 1 adopted   Enjoys times with two grandsons    Allergies  Allergen Reactions  . Tetanus Toxoid     REACTION: anaphylactic shock  . Codeine     REACTION: n/ v  . Eggs Or Egg-Derived Products     History egg allergy in childhood.  Now she can eat eggs w/o troubles except for photosensitivity after eating eggs     Constitutional: . Denies headache, fatigue, fever or abrupt weight changes.  HEENT:  Positive sore throat and nasal congestion. Denies eye redness, eye pain, pressure behind the eyes, facial pain, nasal congestion, ear pain, ringing in the ears, wax buildup, runny nose or bloody nose. Respiratory: Positive cough. Denies difficulty breathing or shortness of breath.  Cardiovascular: Denies chest pain, chest tightness, palpitations or swelling in the hands or feet.   No other specific complaints  in a complete review of systems (except as listed in HPI above).  Objective:   BP 132/86   Pulse (!) 102   Temp 98.1 F (36.7 C) (Oral)   Wt 290 lb (131.5 kg)   SpO2 97%   BMI 53.04 kg/m   Wt Readings from Last 3 Encounters:  12/12/17 290 lb (131.5 kg)  11/27/17 289 lb (131.1 kg)  08/21/17 294 lb (133.4 kg)     General: Appears her stated age, obese, in NAD. HEENT: Head: normal shape and size, no sinus tenderness noted; Ears: Tm's gray and intact, normal light reflex; Nose: mucosa pink and moist, septum midline; Throat/Mouth: + PND. Teeth present, mucosa pink and moist, no exudate noted, no lesions or ulcerations noted.  Neck: No cervical lymphadenopathy.  Cardiovascular: Tachycardic with normal rhythm. S1,S2 noted.  No murmur, rubs or gallops noted.  Pulmonary/Chest: Normal effort and positive vesicular breath sounds. No respiratory distress. No wheezes, rales or ronchi noted.       Assessment & Plan:   Nasal Congestion, Sore Throat, Cough secondary to Allergic Rhinitis:  RST: negative Get some rest and drink plenty of water Do salt water gargles for the sore throat Start Zyrtec and Flonase OTC eRx for Tessalon Pearls 200 mg TID prn eRx for Promethazine DM cough syrup QHS prn  RTC as needed or if symptoms persist.   Nicki Reaper, NP

## 2017-12-12 NOTE — Addendum Note (Signed)
Addended by: Roena Malady on: 12/12/2017 03:44 PM   Modules accepted: Orders

## 2017-12-12 NOTE — Patient Instructions (Signed)
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
## 2018-03-14 ENCOUNTER — Other Ambulatory Visit: Payer: Self-pay | Admitting: Family Medicine

## 2018-03-14 DIAGNOSIS — I471 Supraventricular tachycardia: Secondary | ICD-10-CM

## 2018-03-14 DIAGNOSIS — I4719 Other supraventricular tachycardia: Secondary | ICD-10-CM

## 2018-09-09 ENCOUNTER — Encounter: Payer: Self-pay | Admitting: Family Medicine

## 2018-09-09 ENCOUNTER — Ambulatory Visit (INDEPENDENT_AMBULATORY_CARE_PROVIDER_SITE_OTHER): Payer: Managed Care, Other (non HMO) | Admitting: Family Medicine

## 2018-09-09 DIAGNOSIS — R21 Rash and other nonspecific skin eruption: Secondary | ICD-10-CM

## 2018-09-09 MED ORDER — PREDNISONE 20 MG PO TABS: ORAL_TABLET | ORAL | 0 refills | Status: DC

## 2018-09-09 NOTE — Progress Notes (Signed)
Interactive audio and video telecommunications were attempted between this provider and patient, however failed, due to patient having technical difficulties OR patient did not have access to video capability.  We continued and completed visit with audio only.   Virtual Visit via Telephone Note  I connected with patient on 09/09/18 at 430 PM by telephone and verified that I am speaking with the correct person using two identifiers.  Location of patient: home  Location of MD: [place] Name of referring provider (if blank then none associated): Names per persons and role in encounter:  MD: Earlyne Iba, Patient: name listed above.    I discussed the limitations, risks, security and privacy concerns of performing an evaluation and management service by telephone and the availability of in person appointments. I also discussed with the patient that there may be a patient responsible charge related to this service. The patient expressed understanding and agreed to proceed.  Cc: rash  History of Present Illness: rash started about 3 weeks ago.  Started with mild sx, but now progressive.  Taking benadryl with fatigue the next day.  No one else with similar. She thought it was eczema.  No FCNAVD.  Diffuse rash on trunk and ext.  Not on the palms of hands or face.  Bumpy rash, with more irritation after scratching.  No ulceration.  No intraoral lesions.  Using hydrocortisone with minimal relief.    She has h/o similar in the past during high stress episodes but this was more symptomatic than prev episodes.  She has high stress noted recently at work.     Observations/Objective: nad Speech wnl.  See mychart message re: pictures.   Rash blanches per patient report.    Assessment and Plan: Presumed eczema or similar steroid responsive dermatitis with incomplete tx with topical hydrocortisone.  Start prednisone with routine cautions and update me as needed. She agrees.   Follow Up  Instructions:see above.    I discussed the assessment and treatment plan with the patient. The patient was provided an opportunity to ask questions and all were answered. The patient agreed with the plan and demonstrated an understanding of the instructions.   The patient was advised to call back or seek an in-person evaluation if the symptoms worsen or if the condition fails to improve as anticipated.  I provided 15 minutes of non-face-to-face time during this encounter.  Elsie Stain, MD

## 2018-09-11 DIAGNOSIS — R21 Rash and other nonspecific skin eruption: Secondary | ICD-10-CM | POA: Insufficient documentation

## 2018-09-11 NOTE — Assessment & Plan Note (Signed)
Presumed eczema or similar steroid responsive dermatitis with incomplete tx with topical hydrocortisone.  Start prednisone with routine cautions and update me as needed. She agrees.

## 2018-10-08 ENCOUNTER — Encounter (INDEPENDENT_AMBULATORY_CARE_PROVIDER_SITE_OTHER): Payer: Self-pay | Admitting: Family Medicine

## 2018-10-08 ENCOUNTER — Ambulatory Visit (INDEPENDENT_AMBULATORY_CARE_PROVIDER_SITE_OTHER): Payer: Managed Care, Other (non HMO) | Admitting: Family Medicine

## 2018-10-08 ENCOUNTER — Other Ambulatory Visit: Payer: Self-pay

## 2018-10-08 VITALS — BP 154/83 | HR 85 | Temp 97.8°F | Ht 62.0 in | Wt 305.0 lb

## 2018-10-08 DIAGNOSIS — Z1331 Encounter for screening for depression: Secondary | ICD-10-CM | POA: Diagnosis not present

## 2018-10-08 DIAGNOSIS — R5383 Other fatigue: Secondary | ICD-10-CM

## 2018-10-08 DIAGNOSIS — I10 Essential (primary) hypertension: Secondary | ICD-10-CM

## 2018-10-08 DIAGNOSIS — Z9189 Other specified personal risk factors, not elsewhere classified: Secondary | ICD-10-CM

## 2018-10-08 DIAGNOSIS — R739 Hyperglycemia, unspecified: Secondary | ICD-10-CM

## 2018-10-08 DIAGNOSIS — E739 Lactose intolerance, unspecified: Secondary | ICD-10-CM | POA: Diagnosis not present

## 2018-10-08 DIAGNOSIS — Z6841 Body Mass Index (BMI) 40.0 and over, adult: Secondary | ICD-10-CM

## 2018-10-08 DIAGNOSIS — R0602 Shortness of breath: Secondary | ICD-10-CM | POA: Diagnosis not present

## 2018-10-08 DIAGNOSIS — E876 Hypokalemia: Secondary | ICD-10-CM

## 2018-10-08 DIAGNOSIS — Z0289 Encounter for other administrative examinations: Secondary | ICD-10-CM
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
## 2018-10-09 LAB — CBC WITH DIFFERENTIAL
Basophils Absolute: 0.1 10*3/uL (ref 0.0–0.2)
Basos: 1 %
EOS (ABSOLUTE): 0.4 10*3/uL (ref 0.0–0.4)
Eos: 6 %
Hematocrit: 43.3 % (ref 34.0–46.6)
Hemoglobin: 14.3 g/dL (ref 11.1–15.9)
Immature Grans (Abs): 0 10*3/uL (ref 0.0–0.1)
Immature Granulocytes: 1 %
Lymphocytes Absolute: 1.4 10*3/uL (ref 0.7–3.1)
Lymphs: 23 %
MCH: 28.4 pg (ref 26.6–33.0)
MCHC: 33 g/dL (ref 31.5–35.7)
MCV: 86 fL (ref 79–97)
Monocytes Absolute: 0.6 10*3/uL (ref 0.1–0.9)
Monocytes: 9 %
Neutrophils Absolute: 3.7 10*3/uL (ref 1.4–7.0)
Neutrophils: 60 %
RBC: 5.03 x10E6/uL (ref 3.77–5.28)
RDW: 13.8 % (ref 11.7–15.4)
WBC: 6.1 10*3/uL (ref 3.4–10.8)

## 2018-10-09 LAB — VITAMIN B12: Vitamin B-12: 817 pg/mL (ref 232–1245)

## 2018-10-09 LAB — T4, FREE: Free T4: 1.16 ng/dL (ref 0.82–1.77)

## 2018-10-09 LAB — COMPREHENSIVE METABOLIC PANEL
ALT: 28 IU/L (ref 0–32)
AST: 23 IU/L (ref 0–40)
Albumin/Globulin Ratio: 1.8 (ref 1.2–2.2)
Albumin: 4.2 g/dL (ref 3.8–4.9)
Alkaline Phosphatase: 113 IU/L (ref 39–117)
BUN/Creatinine Ratio: 20 (ref 9–23)
BUN: 15 mg/dL (ref 6–24)
Bilirubin Total: 0.3 mg/dL (ref 0.0–1.2)
CO2: 20 mmol/L (ref 20–29)
Calcium: 9 mg/dL (ref 8.7–10.2)
Chloride: 107 mmol/L — ABNORMAL HIGH (ref 96–106)
Creatinine, Ser: 0.76 mg/dL (ref 0.57–1.00)
GFR calc Af Amer: 103 mL/min/{1.73_m2} (ref >59)
GFR calc non Af Amer: 89 mL/min/{1.73_m2} (ref >59)
Globulin, Total: 2.4 g/dL (ref 1.5–4.5)
Glucose: 93 mg/dL (ref 65–99)
Potassium: 4.5 mmol/L (ref 3.5–5.2)
Sodium: 142 mmol/L (ref 134–144)
Total Protein: 6.6 g/dL (ref 6.0–8.5)

## 2018-10-09 LAB — HEMOGLOBIN A1C
Est. average glucose Bld gHb Est-mCnc: 103 mg/dL
Hgb A1c MFr Bld: 5.2 % (ref 4.8–5.6)

## 2018-10-09 LAB — LIPID PANEL WITH LDL/HDL RATIO
Cholesterol, Total: 211 mg/dL — ABNORMAL HIGH (ref 100–199)
HDL: 40 mg/dL (ref >39)
LDL Calculated: 139 mg/dL — ABNORMAL HIGH (ref 0–99)
LDl/HDL Ratio: 3.5 ratio — ABNORMAL HIGH (ref 0.0–3.2)
Triglycerides: 160 mg/dL — ABNORMAL HIGH (ref 0–149)
VLDL Cholesterol Cal: 32 mg/dL (ref 5–40)

## 2018-10-09 LAB — VITAMIN D 25 HYDROXY (VIT D DEFICIENCY, FRACTURES): Vit D, 25-Hydroxy: 31.1 ng/mL (ref 30.0–100.0)

## 2018-10-09 LAB — INSULIN, RANDOM: INSULIN: 18.1 u[IU]/mL (ref 2.6–24.9)

## 2018-10-09 LAB — T3: T3, Total: 129 ng/dL (ref 71–180)

## 2018-10-09 LAB — TSH: TSH: 2.03 u[IU]/mL (ref 0.450–4.500)

## 2018-10-09 LAB — FOLATE: Folate: >20 ng/mL (ref >3.0)

## 2018-10-15 ENCOUNTER — Ambulatory Visit (INDEPENDENT_AMBULATORY_CARE_PROVIDER_SITE_OTHER): Payer: Managed Care, Other (non HMO) | Admitting: Psychology

## 2018-10-15 ENCOUNTER — Other Ambulatory Visit: Payer: Self-pay

## 2018-10-15 DIAGNOSIS — F3289 Other specified depressive episodes: Secondary | ICD-10-CM | POA: Diagnosis not present
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
## 2018-10-22 ENCOUNTER — Ambulatory Visit (INDEPENDENT_AMBULATORY_CARE_PROVIDER_SITE_OTHER): Payer: Managed Care, Other (non HMO) | Admitting: Family Medicine

## 2018-10-22 ENCOUNTER — Other Ambulatory Visit: Payer: Self-pay

## 2018-10-22 ENCOUNTER — Encounter (INDEPENDENT_AMBULATORY_CARE_PROVIDER_SITE_OTHER): Payer: Self-pay | Admitting: Family Medicine

## 2018-10-22 VITALS — BP 105/73 | HR 84 | Temp 98.3°F | Ht 62.0 in | Wt 302.0 lb

## 2018-10-22 DIAGNOSIS — Z9189 Other specified personal risk factors, not elsewhere classified: Secondary | ICD-10-CM | POA: Diagnosis not present

## 2018-10-22 DIAGNOSIS — E8881 Metabolic syndrome: Secondary | ICD-10-CM | POA: Diagnosis not present

## 2018-10-22 DIAGNOSIS — E88819 Insulin resistance, unspecified: Secondary | ICD-10-CM

## 2018-10-22 DIAGNOSIS — E66813 Obesity, class 3: Secondary | ICD-10-CM

## 2018-10-22 DIAGNOSIS — E782 Mixed hyperlipidemia: Secondary | ICD-10-CM | POA: Diagnosis not present

## 2018-10-22 DIAGNOSIS — E559 Vitamin D deficiency, unspecified: Secondary | ICD-10-CM | POA: Diagnosis not present

## 2018-10-22 DIAGNOSIS — Z6841 Body Mass Index (BMI) 40.0 and over, adult: Secondary | ICD-10-CM

## 2018-10-22 MED ORDER — METFORMIN HCL 500 MG PO TABS: 500.0000 mg | ORAL_TABLET | Freq: Every day | ORAL | 0 refills | Status: DC

## 2018-10-22 MED ORDER — VITAMIN D 125 MCG (5000 UT) PO CAPS: 5000.0000 [IU] | ORAL_CAPSULE | Freq: Every day | ORAL | 0 refills | Status: DC
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
## 2018-10-29 NOTE — Progress Notes (Signed)
Office: [phone]  /  Fax: [phone]    Date: October 30, 2018   Appointment Start Time: 2:37pm Duration: 22 minutes Provider: Lawerance CruelGaytri Lorimer Tiberio, Psy.D. 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 female presenting via [employer] WebEx for a follow-up appointment to address the previously established treatment goal of decreasing emotional eating. Of note, Jodi Ballard called the office as she was unable to locate the e-mail with the secure link. This provider called her at 2:34pm and informed her the e-mail with the secure link was re-sent. As such, today's appointment was initiated 7 minutes late. Notably, due to connection issues on Jodi Ballard's end, she called into today's Webex appointment. Today's appointment was a telepsychological visit, as this provider's clinic is seeing a limited number of patients for in-person visits due to COVID-19. Therapeutic services will resume to in-person appointments once deemed appropriate. Jodi Ballard expressed understanding regarding the rationale for telepsychological services, and provided verbal consent for today's appointment. Prior to proceeding with today's appointment, Jodi Ballard's physical location at the time of this appointment was obtained. Jodi Ballard reported she was at work and provided the address. In the event of technical difficulties, Jodi Ballard shared a phone number she could be reached at. Jodi Ballard and this provider participated in today's telepsychological service. Also, Jodi Ballard denied anyone else being present in the room or on the WebEx appointment call.   This provider conducted a brief check-in and verbally administered the PHQ-9 and GAD-7. Jodi Ballard stated, "I actually feel pretty good." She reported weight loss. Jodi Ballard stated she is eating to the congruent to the meal plan and started taking Metformin. Regarding emotional  eating, Jodi Ballard observed having urges to eat at night; therefore, she saves snack calories for her evening snack. Moreover, psychoeducation regarding triggers for emotional eating was provided. Jodi Ballard was provided a handout, and encouraged to utilize the handout between now and the next appointment to increase awareness of triggers and frequency. Jodi Ballard agreed. This provider also discussed behavioral strategies for specific triggers, such as placing the utensil down when conversing to avoid mindless eating. Jodi Ballard provided verbal consent during today's appointment for this provider to send the handout for triggers via e-mail. Jodi Ballard was receptive to today's session as evidenced by openness to sharing, responsiveness to feedback, and willingness to explore triggers for emotional eating.  Mental Status Examination:  Appearance: unable to assess  Behavior: unable to assess  Mood: euthymic Affect: mood congruent Speech: normal in rate, volume, and tone Eye Contact: unable to assess  Psychomotor Activity: unable to assess  Thought Process: linear, logical, and goal directed  Content/Perceptual Disturbances: denies suicidal and homicidal ideation, plan, and intent and no hallucinations, delusions, bizarre thinking or behavior reported or observed Orientation: time, person, place and purpose of appointment Cognition/Sensorium: memory, attention, language, and fund of knowledge intact  Insight: good Judgment: good  Structured Assessment Results: The Patient Health Questionnaire-9 (PHQ-9) is a self-report measure that assesses symptoms and severity of depression over the course of the last two weeks. Jodi Ballard obtained a score of 2 suggesting minimal depression. Jodi Ballard finds the endorsed symptoms to be somewhat difficult. Jodi Ballard interest or pleasure in doing things 0  Feeling down, depressed, or hopeless 0  Trouble falling or staying asleep, or sleeping too much 2  Feeling tired or having Jodi Ballard energy 0  Poor  appetite or overeating 0  Feeling bad about yourself --- or  that you are a failure or have let yourself or your family down 0  Trouble concentrating on things, such as reading the newspaper or watching television 0  Moving or speaking so slowly that other people could have noticed? Or the opposite --- being so fidgety or restless that you have been moving around a lot more than usual 0  Thoughts that you would be better off dead or hurting yourself in some way 0  PHQ-9 Score 2    The Generalized Anxiety Disorder-7 (GAD-7) is a brief self-report measure that assesses symptoms of anxiety over the course of the last two weeks. Jodi Ballard obtained a score of 0. Feeling nervous, anxious, on edge 0  Not being able to stop or control worrying 0  Worrying too much about different things 0  Trouble relaxing 0  Being so restless that it's hard to sit still 0  Becoming easily annoyed or irritable 0  Feeling afraid as if something awful might happen 0  GAD-7 Score 0   Interventions:  Conducted a brief chart review Verbal administration of PHQ-9 and GAD-7 for symptom monitoring Provided empathic reflections and validation Reviewed content from the previous session Psychoeducation provided regarding triggers for emotional eating Provided positive reinforcement Focused on rapport building Employed supportive psychotherapy interventions to facilitate reduced distress, and to improve coping skills with identified stressors  DSM-5 Diagnosis: 311 (F32.8) Other Specified Depressive Disorder, Emotional Eating Behaviors  Treatment Goal & Progress: During the initial appointment with this provider, the following treatment goal was established: decrease emotional eating. Progress is limited, as Jodi Ballard has just begun treatment with this provider; however, she is receptive to the interaction and interventions and rapport is being established.   Plan: Jodi Ballard continues to appear able and willing to participate as  evidenced by engagement in reciprocal conversation, and asking questions for clarification as appropriate. The next appointment will be scheduled in two weeks, which will be via [employer]. Once this provider's office resumes in-person appointments and it is deemed appropriate, Jodi Ballard will be notified. The next session will focus on reviewing triggers for emotional eating and the introduction of mindfulness.

## 2018-10-30 ENCOUNTER — Other Ambulatory Visit: Payer: Self-pay

## 2018-10-30 ENCOUNTER — Ambulatory Visit (INDEPENDENT_AMBULATORY_CARE_PROVIDER_SITE_OTHER): Payer: Managed Care, Other (non HMO) | Admitting: Psychology

## 2018-10-30 DIAGNOSIS — F3289 Other specified depressive episodes: Secondary | ICD-10-CM

## 2018-11-04 ENCOUNTER — Other Ambulatory Visit: Payer: Self-pay

## 2018-11-04 DIAGNOSIS — Z20822 Contact with and (suspected) exposure to covid-19: Secondary | ICD-10-CM

## 2018-11-05 LAB — NOVEL CORONAVIRUS, NAA: SARS-CoV-2, NAA: NOT DETECTED

## 2018-11-06 ENCOUNTER — Other Ambulatory Visit: Payer: Self-pay

## 2018-11-06 ENCOUNTER — Telehealth (INDEPENDENT_AMBULATORY_CARE_PROVIDER_SITE_OTHER): Payer: Managed Care, Other (non HMO) | Admitting: Family Medicine

## 2018-11-06 ENCOUNTER — Encounter (INDEPENDENT_AMBULATORY_CARE_PROVIDER_SITE_OTHER): Payer: Self-pay | Admitting: Family Medicine

## 2018-11-06 DIAGNOSIS — Z6841 Body Mass Index (BMI) 40.0 and over, adult: Secondary | ICD-10-CM

## 2018-11-06 DIAGNOSIS — E8881 Metabolic syndrome: Secondary | ICD-10-CM | POA: Diagnosis not present
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
## 2018-11-12 MED ORDER — METFORMIN HCL 500 MG PO TABS: 500.0000 mg | ORAL_TABLET | Freq: Every day | ORAL | 0 refills | Status: DC

## 2018-11-12 NOTE — Progress Notes (Unsigned)
Office: [phone]  /  Fax: [phone]    Date: November 14, 2018   Appointment Start Time:*** Duration:*** Provider: Glennie Isle, Psy.D. Type of Session: Individual Therapy  Location of Patient: *** Location of Provider: {Location of Service:[address]} Type of Contact: Telepsychological Visit via [employer] WebEx   Session Content: Jodi Ballard is a 53 y.o. female presenting via [place] for a follow-up appointment to address the previously established treatment goal of decreasing emotional eating. Today's appointment was a telepsychological visit, as this provider's clinic is seeing a limited number of patients for in-person visits due to COVID-19. Therapeutic services will resume to in-person appointments once deemed appropriate. Aymee expressed understanding regarding the rationale for telepsychological services, and provided verbal consent for today's appointment. Prior to proceeding with today's appointment, [name]'s physical location at the time of this appointment was obtained. [name] reported she was at *** and provided the address. In the event of technical difficulties, [name] shared a phone number she could be reached at. [name] and this provider participated in today's telepsychological service. Also, [name] denied anyone else being present in the room or on the WebEx appointment ***.  This provider conducted a brief check-in and verbally administered the PHQ-9 and GAD-7. *** [name] was receptive to today's session as evidenced by openness to sharing, responsiveness to feedback, and ***.  Mental Status Examination:  Appearance: {Appearance:22431} Behavior: {Behavior:22445} Mood: {Teletherapy mood:22435} Affect: {Affect:22436} Speech: {Speech:22432} Eye Contact: {Eye Contact:22433} Psychomotor Activity: {Motor Activity:22434} Thought Process: {thought process:22448}  Content/Perceptual Disturbances: {disturbances:22451} Orientation: {Orientation:22437} Cognition/Sensorium:  {gbcognition:22449} Insight: {Insight:22446} Judgment: {Insight:22446}  Structured Assessment Results: The Patient Health Questionnaire-9 (PHQ-9) is a self-report measure that assesses symptoms and severity of depression over the course of the last two weeks. [name] obtained a score of *** suggesting {GBPHQ9SEVERITY:21752}. [name] finds the endorsed symptoms to be {gbphq9difficulty:21754}. Little interest or pleasure in doing things ***  Feeling down, depressed, or hopeless ***  Trouble falling or staying asleep, or sleeping too much ***  Feeling tired or having little energy ***  Poor appetite or overeating ***  Feeling bad about yourself --- or that you are a failure or have let yourself or your family down ***  Trouble concentrating on things, such as reading the newspaper or watching television ***  Moving or speaking so slowly that other people could have noticed? Or the opposite --- being so fidgety or restless that you have been moving around a lot more than usual ***  Thoughts that you would be better off dead or hurting yourself in some way ***  PHQ-9 Score ***    The Generalized Anxiety Disorder-7 (GAD-7) is a brief self-report measure that assesses symptoms of anxiety over the course of the last two weeks. [name] obtained a score of *** suggesting {gbgad7severity:21753}. [name] finds the endorsed symptoms to be {gbphq9difficulty:21754}. Feeling nervous, anxious, on edge ***  Not being able to stop or control worrying ***  Worrying too much about different things ***  Trouble relaxing ***  Being so restless that it's hard to sit still ***  Becoming easily annoyed or irritable ***  Feeling afraid as if something awful might happen ***  GAD-7 Score ***   Interventions:  {Interventions:22172}  DSM-5 Diagnosis: 311 (F32.8) Other Specified Depressive Disorder, Emotional Eating Behaviors  Treatment Goal & Progress: During the initial appointment with this provider, the following  treatment goal was established: decrease emotional eating. [name] has demonstrated progress in her goal as evidenced by ***  Plan: [name] continues to appear able and willing  to participate as evidenced by engagement in reciprocal conversation, and asking questions for clarification as appropriate. The next appointment will be scheduled in {gbweeks:21758}, which will be via [employer]. The next session will focus on reviewing learned skills, and working towards the established treatment goal.***

## 2018-11-14 ENCOUNTER — Ambulatory Visit (INDEPENDENT_AMBULATORY_CARE_PROVIDER_SITE_OTHER): Payer: Managed Care, Other (non HMO) | Admitting: Psychology

## 2018-11-19 NOTE — Progress Notes (Signed)
Office: [phone]  /  Fax: [phone]    Date: November 21, 2018   Appointment Start Time: 2:33pm Duration: 24 minutes Provider: Glennie Isle, Psy.D. 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 female presenting via [place] for a follow-up appointment to address the previously established treatment goal of decreasing emotional eating. Of note, this provider called Cheyene at 2:32pm as she did not present for the [facility] appointment. She shared she was unsure if it was a [employer] or not. Thus, the e-mail with the secure link was re-sent. As such, today's appointment was initiated 3 minutes late. Today's appointment was a telepsychological visit, as this provider's clinic is seeing a limited number of patients for in-person visits due to COVID-19. Therapeutic services will resume to in-person appointments once deemed appropriate. Jahniah expressed understanding regarding the rationale for telepsychological services, and provided verbal consent for today's appointment. Prior to proceeding with today's appointment, Meeghan's physical location at the time of this appointment was obtained. Taiesha reported she was at work and provided the address. In the event of technical difficulties, Johnnette shared a phone number she could be reached at. Meily and this provider participated in today's telepsychological service. Also, Devyn denied anyone else being present in the room or on the WebEx appointment.  This provider conducted a brief check-in and verbally administered the PHQ-9 and GAD-7. Marvia shared, "I've been doing really good, but today has been a rough week." This was explored further. She stated there has been an increase in stress due to work. Notably, Lequita reflected engaging in learned skills (e.g., asking for a to go box before she  started eating) when she went out to eat. This was positively reinforced. Due to stress being a recent trigger for emotional eating, psychoeducation regarding mindfulness was provided. A handout was provided to [facility] with further information regarding mindfulness, including exercises. This provider also explained the benefit of mindfulness as it relates to emotional eating. Taquanna was encouraged to engage in the provided exercises between now and the next appointment with this provider. Marjan agreed. She was led through an exercise involving her senses. She described feeling surprised about how she felt after doing the exercise (I.e.,"good"). Clarrissa provided verbal consent during today's appointment for this provider to send the handout for mindfulness via e-mail. Venetia was receptive to today's session as evidenced by openness to sharing, responsiveness to feedback, and willingness to engage in mindfulness exercises.  Mental Status Examination:  Appearance: neat Behavior: cooperative Mood: euthymic Affect: mood congruent Speech: normal in rate, volume, and tone Eye Contact: appropriate Psychomotor Activity: appropriate Thought Process: linear, logical, and goal directed  Content/Perceptual Disturbances: no hallucinations, delusions, bizarre thinking or behavior reported or observed and no evidence of suicidal and homicidal ideation, plan, and intent Orientation: time, person, place and purpose of appointment Cognition/Sensorium: memory, attention, language, and fund of knowledge intact  Insight: good Judgment: good  Structured Assessment Results: The Patient Health Questionnaire-9 (PHQ-9) is a self-report measure that assesses symptoms and severity of depression over the course of the last two weeks. Yui obtained a score of 7 suggesting mild depression. Deann finds the endorsed symptoms to be not difficult at all. Little interest or pleasure in doing things 0  Feeling down, depressed, or hopeless  1  Trouble falling or staying asleep, or sleeping too much 2  Feeling  tired or having little energy 1  Poor appetite or overeating 1  Feeling bad about yourself --- or that you are a failure or have let yourself or your family down 0  Trouble concentrating on things, such as reading the newspaper or watching television 0  Moving or speaking so slowly that other people could have noticed? Or the opposite --- being so fidgety or restless that you have been moving around a lot more than usual 2  Thoughts that you would be better off dead or hurting yourself in some way 0  PHQ-9 Score 7    The Generalized Anxiety Disorder-7 (GAD-7) is a brief self-report measure that assesses symptoms of anxiety over the course of the last two weeks. Kendal obtained a score of 9 suggesting mild anxiety. Rainey finds the endorsed symptoms to be not difficult at all. Feeling nervous, anxious, on edge 3  Not being able to stop or control worrying 2  Worrying too much about different things 0  Trouble relaxing 1  Being so restless that it's hard to sit still 2  Becoming easily annoyed or irritable 1  Feeling afraid as if something awful might happen 0  GAD-7 Score 9   Interventions:  Conducted a brief chart review Verbal administration of PHQ-9 and GAD-7 for symptom monitoring Provided empathic reflections and validation Reviewed content from the previous session Psychoeducation provided regarding mindfulness Engaged patient in a mindfulness exercise Provided positive reinforcement Employed supportive psychotherapy interventions to facilitate reduced distress, and to improve coping skills with identified stressors Employed acceptance and commitment interventions to emphasize mindfulness and acceptance without struggle  DSM-5 Diagnosis: 311 (F32.8) Other Specified Depressive Disorder, Emotional Eating Behaviors  Treatment Goal & Progress: During the initial appointment with this provider, the following  treatment goal was established: decrease emotional eating. Lanaysia has demonstrated progress in her goal as evidenced by increased awareness of hunger patterns and triggers for emotional eating.   Plan: Laveda AbbeLorie continues to appear able and willing to participate as evidenced by engagement in reciprocal conversation, and asking questions for clarification as appropriate. The next appointment will be scheduled in three weeks, which will be via [employer] WebEx. The next session will focus further on mindfulness.

## 2018-11-21 ENCOUNTER — Other Ambulatory Visit: Payer: Self-pay

## 2018-11-21 ENCOUNTER — Ambulatory Visit (INDEPENDENT_AMBULATORY_CARE_PROVIDER_SITE_OTHER): Payer: Managed Care, Other (non HMO) | Admitting: Psychology

## 2018-11-21 DIAGNOSIS — F3289 Other specified depressive episodes: Secondary | ICD-10-CM

## 2018-11-27 ENCOUNTER — Other Ambulatory Visit: Payer: Self-pay

## 2018-11-27 ENCOUNTER — Telehealth (INDEPENDENT_AMBULATORY_CARE_PROVIDER_SITE_OTHER): Payer: Managed Care, Other (non HMO) | Admitting: Family Medicine

## 2018-11-27 ENCOUNTER — Encounter (INDEPENDENT_AMBULATORY_CARE_PROVIDER_SITE_OTHER): Payer: Self-pay | Admitting: Family Medicine

## 2018-11-27 DIAGNOSIS — Z6841 Body Mass Index (BMI) 40.0 and over, adult: Secondary | ICD-10-CM

## 2018-11-27 DIAGNOSIS — E8881 Metabolic syndrome: Secondary | ICD-10-CM

## 2018-11-27 MED ORDER — METFORMIN HCL 500 MG PO TABS: 500.0000 mg | ORAL_TABLET | Freq: Every day | ORAL | 0 refills | Status: DC
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
## 2018-11-29 ENCOUNTER — Other Ambulatory Visit: Payer: Self-pay | Admitting: Family Medicine

## 2018-11-29 DIAGNOSIS — I471 Supraventricular tachycardia: Secondary | ICD-10-CM

## 2018-12-10 LAB — HM MAMMOGRAPHY

## 2018-12-10 NOTE — Progress Notes (Unsigned)
Office: [phone]  /  Fax: [phone]    Date: December 16, 2018   Appointment Start Time:*** Duration:*** Provider: Glennie Isle, Psy.D. Type of Session: Individual Therapy  Location of Patient: *** Location of Provider: {Location of Service:[address]} Type of Contact: Telepsychological Visit via [employer] WebEx   Session Content: Jodi Ballard is a 53 y.o. female presenting via [place] for a follow-up appointment to address the previously established treatment goal of decreasing emotional eating. Of note, this provider called Maya at 8:am as she did not present for the [facility] appointment. *** The e-mail with the secure link was re-sent. As such, today's appointment was initiated *** minutes late.  Today's appointment was a telepsychological visit, as this provider's clinic is seeing a limited number of patients for in-person visits due to COVID-19. Therapeutic services will resume to in-person appointments once deemed appropriate. Jodi Ballard expressed understanding regarding the rationale for telepsychological services, and provided verbal consent for today's appointment. Prior to proceeding with today's appointment, Jodi Ballard's physical location at the time of this appointment was obtained. Jodi Ballard reported she was at *** and provided the address. In the event of technical difficulties, Jodi Ballard shared a phone number she could be reached at. Jodi Ballard and this provider participated in today's telepsychological service. Also, Jodi Ballard denied anyone else being present in the room or on the WebEx appointment ***.  This provider conducted a brief check-in and verbally administered the PHQ-9 and GAD-7. *** Jodi Ballard was receptive to today's session as evidenced by openness to sharing, responsiveness to feedback, and ***.  Mental Status Examination:  Appearance: {Appearance:22431} Behavior: {Behavior:22445} Mood: {Teletherapy mood:22435} Affect: {Affect:22436} Speech: {Speech:22432} Eye Contact: {Eye Contact:22433}  Psychomotor Activity: {Motor Activity:22434} Thought Process: {thought process:22448}  Content/Perceptual Disturbances: {disturbances:22451} Orientation: {Orientation:22437} Cognition/Sensorium: {gbcognition:22449} Insight: {Insight:22446} Judgment: {Insight:22446}  Structured Assessment Results: The Patient Health Questionnaire-9 (PHQ-9) is a self-report measure that assesses symptoms and severity of depression over the course of the last two weeks. Jodi Ballard obtained a score of *** suggesting {GBPHQ9SEVERITY:21752}. Jodi Ballard finds the endorsed symptoms to be {gbphq9difficulty:21754}. Little interest or pleasure in doing things ***  Feeling down, depressed, or hopeless ***  Trouble falling or staying asleep, or sleeping too much ***  Feeling tired or having little energy ***  Poor appetite or overeating ***  Feeling bad about yourself --- or that you are a failure or have let yourself or your family down ***  Trouble concentrating on things, such as reading the newspaper or watching television ***  Moving or speaking so slowly that other people could have noticed? Or the opposite --- being so fidgety or restless that you have been moving around a lot more than usual ***  Thoughts that you would be better off dead or hurting yourself in some way ***  PHQ-9 Score ***    The Generalized Anxiety Disorder-7 (GAD-7) is a brief self-report measure that assesses symptoms of anxiety over the course of the last two weeks. Jodi Ballard obtained a score of *** suggesting {gbgad7severity:21753}. Jodi Ballard finds the endorsed symptoms to be {gbphq9difficulty:21754}. Feeling nervous, anxious, on edge ***  Not being able to stop or control worrying ***  Worrying too much about different things ***  Trouble relaxing ***  Being so restless that it's hard to sit still ***  Becoming easily annoyed or irritable ***  Feeling afraid as if something awful might happen ***  GAD-7 Score ***   Interventions:   {Interventions:22172}  DSM-5 Diagnosis: 311 (F32.8) Other Specified Depressive Disorder, Emotional Eating Behaviors  Treatment Goal & Progress:  During the initial appointment with this provider, the following treatment goal was established: decrease emotional eating. Jodi Ballard has demonstrated progress in her goal as evidenced by {gbtxprogress:22839}. Jodi Ballard also reported a reduction in emotional eating. ***  Plan: Jodi Ballard continues to appear able and willing to participate as evidenced by engagement in reciprocal conversation, and asking questions for clarification as appropriate. The next appointment will be scheduled in {gbweeks:21758}, which will be via [employer] WebEx. The next session will focus on reviewing learned skills, and working towards the established treatment goal.***

## 2018-12-11 ENCOUNTER — Telehealth (INDEPENDENT_AMBULATORY_CARE_PROVIDER_SITE_OTHER): Payer: Managed Care, Other (non HMO) | Admitting: Family Medicine

## 2018-12-11 ENCOUNTER — Encounter (INDEPENDENT_AMBULATORY_CARE_PROVIDER_SITE_OTHER): Payer: Self-pay | Admitting: Family Medicine

## 2018-12-11 ENCOUNTER — Other Ambulatory Visit: Payer: Self-pay

## 2018-12-11 DIAGNOSIS — Z6841 Body Mass Index (BMI) 40.0 and over, adult: Secondary | ICD-10-CM

## 2018-12-11 DIAGNOSIS — R5383 Other fatigue: Secondary | ICD-10-CM
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
## 2018-12-16 ENCOUNTER — Telehealth (INDEPENDENT_AMBULATORY_CARE_PROVIDER_SITE_OTHER): Payer: Self-pay | Admitting: Psychology

## 2018-12-16 ENCOUNTER — Ambulatory Visit (INDEPENDENT_AMBULATORY_CARE_PROVIDER_SITE_OTHER): Payer: Managed Care, Other (non HMO) | Admitting: Psychology

## 2018-12-16 NOTE — Telephone Encounter (Signed)
  Office: [phone]  /  Fax: [phone]  Date of Call:  December 16, 2018  Time of Call: 8:02am Provider: Glennie Isle, PsyD  CONTENT: This provider called Melynda to check-in as she did not present for today's Webex appointment at 8:00am. A HIPAA compliant voicemail was left requesting a call back. Of note, this provider stayed on the [facility] appointment for 8 minutes prior to signing off, which is 3 additional minutes past the clinic's 5 minute grace period policy.    PLAN: This provider will wait for Abbye to call back. If deemed necessary, this provider or the provider's clinic will call Shannon again in approximately one week.

## 2018-12-23 NOTE — Progress Notes (Signed)
Office: [phone]  /  Fax: [phone]    Date: January 02, 2019   Time Seen: 3:00pm Duration: 29 minutes Provider: Lawerance CruelGaytri Aymen Widrig, Psy.D. Type of Session: Individual Therapy  Type of Contact: Face-to-face  Informed Consent for In-Person Services During COVID-19:  During today's appointment, information about the decision to initiate in-person services in light of the COVID-19 public health crisis was discussed. [name] and this provider agreed to meet in person for some or all future appointments. If there is a resurgence of the pandemic or other health concerns arise, telepsychological services may be initiated and any related concerns will be discussed and an attempt to address them will be made. [name] verbally acknowledged understanding that if necessary, this provider may determine there is a need to initiate telepsychological services for everyone's well-being. [name] expressed understanding she may request to initiate telepsychological services, and that request will be respected as long as it is feasible and clinically appropriate. Regarding telepsychological services, Jodi Ballard acknowledged she is ultimately responsible for understanding her insurance benefits as it relates to reimbursement of telepsychological services. Moreover, the risks for opting for in-person services was discussed. [name] verbally acknowledged understanding that by coming to the office, she is assuming the risk of exposure to the coronavirus or other public risk, and the risk may increase if [name] travels by public transportation, cab, or [employer] service. To obtain in-person services, [name] verbally agreed to taking certain precautions (e.g., screening prior to appointment; universal masking; social distancing of 6 feet; proper hand hygiene; no visitors) set forth by [facility] to keep everyone safe from exposure, sickness, and possible death. This information was shared by front desk staff either at the time of  scheduling and/or during the check-in process. [name] expressed understanding that should she not adhere to these safeguards, it may result in starting/returning to a telepsychological service arrangement and/or the exploration of other options for treatment. [name] acknowledged understanding that [facility] will follow the protocol set forth by [facility] should a patient present with a fever or other symptoms or disclose recent exposure, which will include rescheduling the appointment. Furthermore, [name] acknowledged understanding that precautions may change if additional local, state or federal orders or guidelines are published. This provider also shared that if [name] tests positive for the coronavirus, this provider may be required to notify local health authorities that Jodi Ballard was in the [facility] clinic. Only minimum information necessary for data collection will be disclosed. This provider will follow [place]'s disclosure policy should this provider or staff test positive for the coronavirus. To avoid handling of paper/writing instruments and increasing likelihood of touching, verbal consent was obtained by [name] during today's appointment prior to proceeding. [name] provided verbal consent to proceed, and acknowledged understanding that by verbally consenting to proceed, she is agreeable to all information noted above.   Session Content: Jodi Ballard is a 53 y.o. female presenting for a follow-up appointment to address the previously established treatment goal of decreasing emotional eating. The session was initiated with the administration of the PHQ-9 and GAD-7, as well as a brief check-in. [name] shared, "I've struggled with eating." She noted, "I need to do what you told me to do." This was explored further. [name] acknowledged she is often "putting" herself "on the side." This provider discussed the importance of self-care using the oxygen mask metaphor. She was observed  becoming tearful and noted, "You're right." Additionally, psychoeducation regarding pleasurable activities, including its impact on emotional eating and overall well-being was  provided. [name] was encouraged to engage in one activity a day and additional activities as needed when triggered to emotionally eat. [name] agreed. [name] provided verbal consent during today's appointment for this provider to send the handout for pleasurable activities via e-mail. Moreover, this provider recommended longer-term therapeutic services due to ongoing stressors and discussed options to establish care with a new provider, including [name] contacting her insurance company for a list of in-network providers, exploring [email or web address], or this provider placing a referral. [name] provided verbal consent for this provider to place a referral to address ongoing stressors and emotional eating. [name] was receptive to today's session as evidenced by openness to sharing, responsiveness to feedback, and willingness to engage in pleasurable activities.  Mental Status Examination:  Appearance: neat Behavior: cooperative Mood: euthymic Affect: mood congruent Speech: normal in rate, volume, and tone Eye Contact: appropriate Psychomotor Activity: appropriate Thought Process: linear, logical, and goal directed  Content/Perceptual Disturbances: no hallucinations, delusions, bizarre thinking or behavior reported or observed and no evidence of suicidal and homicidal ideation, plan, and intent Orientation: time, person, place and purpose of appointment Cognition/Sensorium: memory, attention, language, and fund of knowledge intact  Insight: fair Judgment: fair  Structured Assessment Results: The Patient Health Questionnaire-9 (PHQ-9) is a self-report measure that assesses symptoms and severity of depression over the course of the last two weeks. [name] obtained a score of 4 suggesting minimal depression. [name] finds the endorsed  symptoms to be somewhat difficult. Little interest or pleasure in doing things 0  Feeling down, depressed, or hopeless 1  Trouble falling or staying asleep, or sleeping too much 1  Feeling tired or having little energy 0  Poor appetite or overeating 1  Feeling bad about yourself --- or that you are a failure or have let yourself or your family down 0  Trouble concentrating on things, such as reading the newspaper or watching television 0  Moving or speaking so slowly that other people could have noticed? Or the opposite --- being so fidgety or restless that you have been moving around a lot more than usual 1  Thoughts that you would be better off dead or hurting yourself in some way 0  PHQ-9 Score 4    The Generalized Anxiety Disorder-7 (GAD-7) is a brief self-report measure that assesses symptoms of anxiety over the course of the last two weeks. Gayathri obtained a score of 8 suggesting mild anxiety. Lihanna finds the endorsed symptoms to be not difficult at all. Feeling nervous, anxious, on edge 1  Not being able to stop or control worrying 3  Worrying too much about different things 3  Trouble relaxing 0  Being so restless that it's hard to sit still 0  Becoming easily annoyed or irritable 1  Feeling afraid as if something awful might happen 0  GAD-7 Score 8   Interventions:  Conducted a brief chart review Verbal administration of PHQ-9 and GAD-7 for symptom monitoring Provided empathic reflections and validation Reviewed content from the previous session Psychoeducation provided regarding pleasurable activities Employed supportive psychotherapy interventions to facilitate reduced distress, and to improve coping skills with identified stressors  Psychoeducation provided regarding the importance of self-care Discussed referral for longer-term therapeutic services  DSM-5 Diagnosis: 311 (F32.8) Other Specified Depressive Disorder, Emotional Eating Behaviors  Treatment Goal & Progress:  During the initial appointment with this provider, the following treatment goal was established: decrease emotional eating. Rosibel has demonstrated progress in her goal as evidenced by increased awareness of hunger patterns and triggers for  emotional eating. Beckey also demonstrates willingness to engage in pleasurable activities.  Plan: Makena continues to appear able and willing to participate as evidenced by engagement in reciprocal conversation, and asking questions for clarification as appropriate. The next appointment will be scheduled in three weeks, which will be via [employer] Webex per Ashleyanne's request. The next session will focus further on mindfulness as it was not discussed today based on Onyx's presenting concerns.

## 2018-12-25 ENCOUNTER — Other Ambulatory Visit: Payer: Self-pay

## 2018-12-25 ENCOUNTER — Telehealth (INDEPENDENT_AMBULATORY_CARE_PROVIDER_SITE_OTHER): Payer: Managed Care, Other (non HMO) | Admitting: Family Medicine

## 2018-12-25 ENCOUNTER — Encounter (INDEPENDENT_AMBULATORY_CARE_PROVIDER_SITE_OTHER): Payer: Self-pay | Admitting: Family Medicine

## 2018-12-25 DIAGNOSIS — F3289 Other specified depressive episodes: Secondary | ICD-10-CM | POA: Diagnosis not present

## 2018-12-25 DIAGNOSIS — E8881 Metabolic syndrome: Secondary | ICD-10-CM | POA: Diagnosis not present

## 2018-12-25 DIAGNOSIS — Z6841 Body Mass Index (BMI) 40.0 and over, adult: Secondary | ICD-10-CM | POA: Diagnosis not present

## 2018-12-25 MED ORDER — TOPIRAMATE 50 MG PO TABS: 50.0000 mg | ORAL_TABLET | Freq: Every day | ORAL | 0 refills | Status: DC

## 2018-12-25 MED ORDER — METFORMIN HCL 500 MG PO TABS: 500.0000 mg | ORAL_TABLET | Freq: Every day | ORAL | 0 refills | Status: DC

## 2018-12-30 ENCOUNTER — Other Ambulatory Visit: Payer: Self-pay | Admitting: *Deleted

## 2018-12-30 DIAGNOSIS — Z20822 Contact with and (suspected) exposure to covid-19: Secondary | ICD-10-CM
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
## 2019-01-01 LAB — NOVEL CORONAVIRUS, NAA: SARS-CoV-2, NAA: NOT DETECTED

## 2019-01-02 ENCOUNTER — Ambulatory Visit (INDEPENDENT_AMBULATORY_CARE_PROVIDER_SITE_OTHER): Payer: Managed Care, Other (non HMO) | Admitting: Psychology

## 2019-01-02 ENCOUNTER — Other Ambulatory Visit: Payer: Self-pay

## 2019-01-02 DIAGNOSIS — F3289 Other specified depressive episodes: Secondary | ICD-10-CM | POA: Diagnosis not present

## 2019-01-03 ENCOUNTER — Encounter: Payer: Self-pay | Admitting: Respiratory Therapy

## 2019-01-14 NOTE — Progress Notes (Unsigned)
Office: [phone]  /  Fax: [phone]    Date: January 28, 2019   Appointment Start Time: *** Duration: *** minutes Provider: Glennie Isle, Psy.D. Type of Session: Individual Therapy  Location of Patient: *** Location of Provider: {Location of Service:[address]} Type of Contact: Telepsychological Visit via [employer] WebEx   Session Content: Jodi Ballard is a 53 y.o. female presenting via [place] for a follow-up appointment to address the previously established treatment goal of decreasing emotional eating. Today's appointment was a telepsychological visit, as it is an option for appointments to reduce exposure to COVID-19. Jodi Ballard expressed understanding regarding the rationale for telepsychological services, and provided verbal consent for today's appointment. Prior to proceeding with today's appointment, Jodi Ballard's physical location at the time of this appointment was obtained. Jodi Ballard reported she was at *** and provided the address. In the event of technical difficulties, Jodi Ballard shared a phone number she could be reached at. Jodi Ballard and this provider participated in today's telepsychological service. Also, Jodi Ballard denied anyone else being present in the room or on the WebEx appointment ***.  This provider conducted a brief check-in and verbally administered the PHQ-9 and GAD-7. *** Jodi Ballard was receptive to today's session as evidenced by openness to sharing, responsiveness to feedback, and ***.  Mental Status Examination:  Appearance: {Appearance:22431} Behavior: {Behavior:22445} Mood: {gbmood:21757} Affect: {Affect:22436} Speech: {Speech:22432} Eye Contact: {Eye Contact:22433} Psychomotor Activity: {Motor Activity:22434} Thought Process: {thought process:22448}  Content/Perceptual Disturbances: {disturbances:22451} Orientation: {Orientation:22437} Cognition/Sensorium: {gbcognition:22449} Insight: {Insight:22446} Judgment: {Insight:22446}  Structured Assessment Results: The Patient Health  Questionnaire-9 (PHQ-9) is a self-report measure that assesses symptoms and severity of depression over the course of the last two weeks. Jodi Ballard obtained a score of *** suggesting {GBPHQ9SEVERITY:21752}. Jodi Ballard finds the endorsed symptoms to be {gbphq9difficulty:21754}. Jodi Ballard ***  Feeling down, depressed, or hopeless ***  Trouble falling or staying asleep, or sleeping too much ***  Feeling tired or having Jodi energy ***  Poor appetite or overeating ***  Feeling bad about yourself --- or that you are a failure or have let yourself or your family down ***  Trouble concentrating on Ballard, such as reading the newspaper or watching television ***  Moving or speaking so slowly that other people could have noticed? Or the opposite --- being so fidgety or restless that you have been moving around a lot more than usual ***  Thoughts that you would be better off dead or hurting yourself in some way ***  PHQ-9 Score ***    The Generalized Anxiety Disorder-7 (GAD-7) is a brief self-report measure that assesses symptoms of anxiety over the course of the last two weeks. Jodi Ballard obtained a score of *** suggesting {gbgad7severity:21753}. Jodi Ballard finds the endorsed symptoms to be {gbphq9difficulty:21754}. Feeling nervous, anxious, on edge ***  Not being able to stop or control worrying ***  Worrying too much about different Ballard ***  Trouble relaxing ***  Being so restless that it's hard to sit still ***  Becoming easily annoyed or irritable ***  Feeling afraid as if something awful might happen ***  GAD-7 Score ***   Interventions:  {Interventions:22172}  DSM-5 Diagnosis: 311 (F32.8) Other Specified Depressive Disorder, Emotional Eating Behaviors  Treatment Goal & Progress: During the initial appointment with this provider, the following treatment goal was established: decrease emotional eating. Jodi Ballard has demonstrated progress in her goal as evidenced by  {gbtxprogress:22839}. Jodi Ballard also reported {gbtxprogress2:22951}.  Plan: Jodi Ballard continues to appear able and willing to participate as evidenced by engagement in reciprocal conversation,  and asking questions for clarification as appropriate. The next appointment will be scheduled in {gbweeks:21758}, which will be via [employer]. The next session will focus on reviewing learned skills, and working towards the established treatment goal.***

## 2019-01-15 ENCOUNTER — Telehealth (INDEPENDENT_AMBULATORY_CARE_PROVIDER_SITE_OTHER): Payer: Managed Care, Other (non HMO) | Admitting: Family Medicine

## 2019-01-15 ENCOUNTER — Other Ambulatory Visit: Payer: Self-pay

## 2019-01-15 ENCOUNTER — Encounter (INDEPENDENT_AMBULATORY_CARE_PROVIDER_SITE_OTHER): Payer: Self-pay | Admitting: Family Medicine

## 2019-01-15 DIAGNOSIS — E559 Vitamin D deficiency, unspecified: Secondary | ICD-10-CM | POA: Diagnosis not present

## 2019-01-15 DIAGNOSIS — Z6841 Body Mass Index (BMI) 40.0 and over, adult: Secondary | ICD-10-CM

## 2019-01-16 ENCOUNTER — Other Ambulatory Visit (INDEPENDENT_AMBULATORY_CARE_PROVIDER_SITE_OTHER): Payer: Self-pay | Admitting: Family Medicine

## 2019-01-16 DIAGNOSIS — F3289 Other specified depressive episodes: Secondary | ICD-10-CM
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
## 2019-01-27 ENCOUNTER — Encounter (INDEPENDENT_AMBULATORY_CARE_PROVIDER_SITE_OTHER): Payer: Self-pay

## 2019-01-27 ENCOUNTER — Other Ambulatory Visit (INDEPENDENT_AMBULATORY_CARE_PROVIDER_SITE_OTHER): Payer: Self-pay | Admitting: Family Medicine

## 2019-01-27 DIAGNOSIS — E8881 Metabolic syndrome: Secondary | ICD-10-CM

## 2019-01-28 ENCOUNTER — Ambulatory Visit (INDEPENDENT_AMBULATORY_CARE_PROVIDER_SITE_OTHER): Payer: Managed Care, Other (non HMO) | Admitting: Psychology

## 2019-01-28 MED ORDER — METFORMIN HCL 500 MG PO TABS: 500.0000 mg | ORAL_TABLET | Freq: Every day | ORAL | 0 refills | Status: DC

## 2019-01-29 ENCOUNTER — Encounter (INDEPENDENT_AMBULATORY_CARE_PROVIDER_SITE_OTHER): Payer: Self-pay | Admitting: Family Medicine

## 2019-01-29 ENCOUNTER — Telehealth (INDEPENDENT_AMBULATORY_CARE_PROVIDER_SITE_OTHER): Payer: Managed Care, Other (non HMO) | Admitting: Family Medicine

## 2019-01-29 ENCOUNTER — Other Ambulatory Visit: Payer: Self-pay

## 2019-01-29 DIAGNOSIS — E782 Mixed hyperlipidemia: Secondary | ICD-10-CM

## 2019-01-29 DIAGNOSIS — Z6841 Body Mass Index (BMI) 40.0 and over, adult: Secondary | ICD-10-CM
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
## 2019-02-05 ENCOUNTER — Telehealth (INDEPENDENT_AMBULATORY_CARE_PROVIDER_SITE_OTHER): Payer: Self-pay | Admitting: Psychology

## 2019-02-05 NOTE — Telephone Encounter (Signed)
  Office: [phone]  /  Fax: [phone]  Date of Call: February 05, 2019  Time of Call: 1:53pm Duration of Call: 2 minutes Provider: Glennie Isle, PsyD  CONTENT: This provider called [facility] to check-in and schedule a follow-up appointment as she canceled her last appointment with this provider on January 28, 2019. She reported she has been busy at work and noted a plan to continue with the new provider with [facility]. No evidence of suicidal and homicidal ideation, plan, or intent  PLAN: Mollie declined future appointments with this provider as she plans to establish care with a new provider for longer-term therapeutic services. She acknowledged understanding that she may request a follow-up appointment with this provider in the future as long as she is still established with the clinic. No further follow-up planned by this provider.

## 2019-02-07 ENCOUNTER — Ambulatory Visit (INDEPENDENT_AMBULATORY_CARE_PROVIDER_SITE_OTHER): Payer: BC Managed Care – PPO | Admitting: Professional

## 2019-02-07 ENCOUNTER — Ambulatory Visit: Payer: Managed Care, Other (non HMO) | Admitting: Professional

## 2019-02-07 DIAGNOSIS — F4323 Adjustment disorder with mixed anxiety and depressed mood: Secondary | ICD-10-CM

## 2019-02-11 ENCOUNTER — Ambulatory Visit (INDEPENDENT_AMBULATORY_CARE_PROVIDER_SITE_OTHER): Payer: BC Managed Care – PPO | Admitting: Professional

## 2019-02-11 DIAGNOSIS — F411 Generalized anxiety disorder: Secondary | ICD-10-CM

## 2019-02-11 DIAGNOSIS — F332 Major depressive disorder, recurrent severe without psychotic features: Secondary | ICD-10-CM | POA: Diagnosis not present

## 2019-02-11 DIAGNOSIS — F423 Hoarding disorder: Secondary | ICD-10-CM

## 2019-02-13 ENCOUNTER — Ambulatory Visit (INDEPENDENT_AMBULATORY_CARE_PROVIDER_SITE_OTHER): Payer: Self-pay | Admitting: Family Medicine

## 2019-02-13 ENCOUNTER — Encounter (INDEPENDENT_AMBULATORY_CARE_PROVIDER_SITE_OTHER): Payer: Self-pay

## 2019-02-17 ENCOUNTER — Other Ambulatory Visit: Payer: Self-pay

## 2019-02-17 ENCOUNTER — Ambulatory Visit (INDEPENDENT_AMBULATORY_CARE_PROVIDER_SITE_OTHER): Payer: BC Managed Care – PPO | Admitting: Family Medicine

## 2019-02-17 ENCOUNTER — Encounter (INDEPENDENT_AMBULATORY_CARE_PROVIDER_SITE_OTHER): Payer: Self-pay | Admitting: Family Medicine

## 2019-02-17 VITALS — BP 139/83 | HR 110 | Temp 98.0°F | Ht 62.0 in | Wt 288.0 lb

## 2019-02-17 DIAGNOSIS — E8881 Metabolic syndrome: Secondary | ICD-10-CM | POA: Diagnosis not present

## 2019-02-17 DIAGNOSIS — Z6841 Body Mass Index (BMI) 40.0 and over, adult: Secondary | ICD-10-CM

## 2019-02-17 DIAGNOSIS — Z9189 Other specified personal risk factors, not elsewhere classified: Secondary | ICD-10-CM | POA: Diagnosis not present

## 2019-02-17 MED ORDER — METFORMIN HCL 500 MG PO TABS: 500.0000 mg | ORAL_TABLET | Freq: Every day | ORAL | 0 refills | Status: DC

## 2019-02-18 ENCOUNTER — Ambulatory Visit (INDEPENDENT_AMBULATORY_CARE_PROVIDER_SITE_OTHER): Payer: BC Managed Care – PPO | Admitting: Professional

## 2019-02-18 DIAGNOSIS — F4310 Post-traumatic stress disorder, unspecified: Secondary | ICD-10-CM | POA: Diagnosis not present
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
## 2019-02-24 ENCOUNTER — Ambulatory Visit (INDEPENDENT_AMBULATORY_CARE_PROVIDER_SITE_OTHER): Payer: BC Managed Care – PPO | Admitting: Professional

## 2019-02-24 DIAGNOSIS — F4310 Post-traumatic stress disorder, unspecified: Secondary | ICD-10-CM

## 2019-03-04 ENCOUNTER — Ambulatory Visit (INDEPENDENT_AMBULATORY_CARE_PROVIDER_SITE_OTHER): Payer: BC Managed Care – PPO | Admitting: Professional

## 2019-03-04 DIAGNOSIS — F4310 Post-traumatic stress disorder, unspecified: Secondary | ICD-10-CM | POA: Diagnosis not present

## 2019-03-11 ENCOUNTER — Encounter (INDEPENDENT_AMBULATORY_CARE_PROVIDER_SITE_OTHER): Payer: Self-pay | Admitting: Family Medicine

## 2019-03-11 ENCOUNTER — Other Ambulatory Visit: Payer: Self-pay

## 2019-03-11 ENCOUNTER — Ambulatory Visit (INDEPENDENT_AMBULATORY_CARE_PROVIDER_SITE_OTHER): Payer: BC Managed Care – PPO | Admitting: Family Medicine

## 2019-03-11 ENCOUNTER — Ambulatory Visit (INDEPENDENT_AMBULATORY_CARE_PROVIDER_SITE_OTHER): Payer: BC Managed Care – PPO | Admitting: Professional

## 2019-03-11 VITALS — BP 138/86 | HR 80 | Temp 98.0°F | Ht 62.0 in | Wt 286.0 lb

## 2019-03-11 DIAGNOSIS — Z9189 Other specified personal risk factors, not elsewhere classified: Secondary | ICD-10-CM | POA: Diagnosis not present

## 2019-03-11 DIAGNOSIS — E559 Vitamin D deficiency, unspecified: Secondary | ICD-10-CM

## 2019-03-11 DIAGNOSIS — F4310 Post-traumatic stress disorder, unspecified: Secondary | ICD-10-CM | POA: Diagnosis not present

## 2019-03-11 DIAGNOSIS — E782 Mixed hyperlipidemia: Secondary | ICD-10-CM

## 2019-03-11 DIAGNOSIS — E8881 Metabolic syndrome: Secondary | ICD-10-CM

## 2019-03-11 DIAGNOSIS — Z6841 Body Mass Index (BMI) 40.0 and over, adult: Secondary | ICD-10-CM

## 2019-03-11 MED ORDER — METFORMIN HCL 500 MG PO TABS: 500.0000 mg | ORAL_TABLET | Freq: Every day | ORAL | 0 refills | Status: DC
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
## 2019-03-12 LAB — COMPREHENSIVE METABOLIC PANEL
ALT: 17 IU/L (ref 0–32)
AST: 15 IU/L (ref 0–40)
Albumin/Globulin Ratio: 1.7 (ref 1.2–2.2)
Albumin: 4.2 g/dL (ref 3.8–4.9)
Alkaline Phosphatase: 113 IU/L (ref 39–117)
BUN/Creatinine Ratio: 18 (ref 9–23)
BUN: 13 mg/dL (ref 6–24)
Bilirubin Total: 0.4 mg/dL (ref 0.0–1.2)
CO2: 23 mmol/L (ref 20–29)
Calcium: 9.1 mg/dL (ref 8.7–10.2)
Chloride: 104 mmol/L (ref 96–106)
Creatinine, Ser: 0.74 mg/dL (ref 0.57–1.00)
GFR calc Af Amer: 106 mL/min/{1.73_m2} (ref >59)
GFR calc non Af Amer: 92 mL/min/{1.73_m2} (ref >59)
Globulin, Total: 2.5 g/dL (ref 1.5–4.5)
Glucose: 92 mg/dL (ref 65–99)
Potassium: 4.5 mmol/L (ref 3.5–5.2)
Sodium: 141 mmol/L (ref 134–144)
Total Protein: 6.7 g/dL (ref 6.0–8.5)

## 2019-03-12 LAB — LIPID PANEL WITH LDL/HDL RATIO
Cholesterol, Total: 201 mg/dL — ABNORMAL HIGH (ref 100–199)
HDL: 42 mg/dL (ref >39)
LDL Chol Calc (NIH): 126 mg/dL — ABNORMAL HIGH (ref 0–99)
LDL/HDL Ratio: 3 ratio (ref 0.0–3.2)
Triglycerides: 188 mg/dL — ABNORMAL HIGH (ref 0–149)
VLDL Cholesterol Cal: 33 mg/dL (ref 5–40)

## 2019-03-12 LAB — HEMOGLOBIN A1C
Est. average glucose Bld gHb Est-mCnc: 100 mg/dL
Hgb A1c MFr Bld: 5.1 % (ref 4.8–5.6)

## 2019-03-12 LAB — INSULIN, RANDOM: INSULIN: 15.8 u[IU]/mL (ref 2.6–24.9)

## 2019-03-12 LAB — VITAMIN D 25 HYDROXY (VIT D DEFICIENCY, FRACTURES): Vit D, 25-Hydroxy: 24.8 ng/mL — ABNORMAL LOW (ref 30.0–100.0)

## 2019-03-18 ENCOUNTER — Ambulatory Visit (INDEPENDENT_AMBULATORY_CARE_PROVIDER_SITE_OTHER): Payer: BC Managed Care – PPO | Admitting: Professional

## 2019-03-18 DIAGNOSIS — F4310 Post-traumatic stress disorder, unspecified: Secondary | ICD-10-CM

## 2019-04-01 ENCOUNTER — Ambulatory Visit (INDEPENDENT_AMBULATORY_CARE_PROVIDER_SITE_OTHER): Payer: BC Managed Care – PPO | Admitting: Professional

## 2019-04-01 DIAGNOSIS — F4310 Post-traumatic stress disorder, unspecified: Secondary | ICD-10-CM | POA: Diagnosis not present

## 2019-04-09 ENCOUNTER — Encounter (INDEPENDENT_AMBULATORY_CARE_PROVIDER_SITE_OTHER): Payer: Self-pay | Admitting: Family Medicine

## 2019-04-09 ENCOUNTER — Other Ambulatory Visit: Payer: Self-pay

## 2019-04-09 ENCOUNTER — Telehealth (INDEPENDENT_AMBULATORY_CARE_PROVIDER_SITE_OTHER): Payer: BC Managed Care – PPO | Admitting: Family Medicine

## 2019-04-09 DIAGNOSIS — E559 Vitamin D deficiency, unspecified: Secondary | ICD-10-CM

## 2019-04-09 DIAGNOSIS — E8881 Metabolic syndrome: Secondary | ICD-10-CM

## 2019-04-09 DIAGNOSIS — E782 Mixed hyperlipidemia: Secondary | ICD-10-CM | POA: Diagnosis not present

## 2019-04-09 DIAGNOSIS — Z6841 Body Mass Index (BMI) 40.0 and over, adult: Secondary | ICD-10-CM

## 2019-04-09 MED ORDER — METFORMIN HCL 500 MG PO TABS: 500.0000 mg | ORAL_TABLET | Freq: Every day | ORAL | 0 refills | Status: DC

## 2019-04-09 MED ORDER — VITAMIN D 125 MCG (5000 UT) PO CAPS: 5000.0000 [IU] | ORAL_CAPSULE | Freq: Every day | ORAL | 0 refills | Status: DC

## 2019-04-10 ENCOUNTER — Ambulatory Visit (INDEPENDENT_AMBULATORY_CARE_PROVIDER_SITE_OTHER): Payer: BC Managed Care – PPO | Admitting: Professional

## 2019-04-10 DIAGNOSIS — F4310 Post-traumatic stress disorder, unspecified: Secondary | ICD-10-CM | POA: Diagnosis not present

## 2019-04-11 NOTE — Progress Notes (Signed)
TeleHealth Visit:  Due to the COVID-19 pandemic, this visit was completed with telemedicine (audio/video) technology to reduce patient and provider exposure as well as to preserve personal protective equipment.   Jodi Ballard has verbally consented to this TeleHealth visit. The patient is located at home, the provider is located at the [employer] [facility] office. The participants in this visit include the listed provider and patient. The visit was conducted today via doxy.me.   Chief Complaint: OBESITY Jodi Ballard is here to discuss her progress with her obesity treatment plan along with follow-up of her obesity related diagnoses. Jodi Ballard is on the Category 2 Plan and states she is following her eating plan approximately 30% of the time. Jodi Ballard states she is active while moving her household.  Today's visit was #: 11 Starting weight: 305 lbs Starting date: 10/08/2018  Interim History: Jodi Ballard feels she has done well maintaining her weight over the holidays. She moved recently and notes some increased stress and decreased meal planning, but she is ready to get back on track and start walking for exercise in her neighborhood.  Subjective:   1. Insulin resistance Hattie's A1c and insulin improved on her most recent labs with diet and metformin. She denies nausea, vomiting, or hypoglycemia. She has been working on diet and denies hyperphagia. I discussed labs with the patient today.  2. Mixed hyperlipidemia Jodi Ballard's triglycerides elevated to 188, but her LDL has improved. She denies chest pain. She has been working on diet, but did a little celebration eating over the holidays. I discussed labs with the patient today.  3. Vitamin D deficiency Jodi Ballard's Vit D level is worsening and has decreased on her most recent labs. She has been on OTC Vit D and notes fatigue. I discussed labs with the patient today.  Assessment/Plan:   1. Insulin resistance We will refill metformin for 1  month- metFORMIN (GLUCOPHAGE) 500 MG tablet; Take 1 tablet (500 mg total) by mouth daily with breakfast.  Dispense: 30 tablet; Refill: 0. We will continue to monitor.  2. Mixed hyperlipidemia Cardiovascular risk and specific lipid/LDL goals reviewed. We discussed several lifestyle modifications today and Florita will continue to work on diet, exercise and weight loss efforts. We will recheck labs in 2 months. Orders and follow up as documented in patient record.   Counseling Intensive lifestyle modifications are the first line treatment for this issue. . Dietary changes: Increase soluble fiber. Decrease simple carbohydrates. . Exercise changes: Moderate to vigorous-intensity aerobic activity 150 minutes per week if tolerated. . Lipid-lowering medications: see documented in medical record.   3. Vitamin D deficiency Jodi Ballard is to hold OTC Vit D, and we will refill Vit D for 1 month- Cholecalciferol (VITAMIN D) 125 MCG (5000 UT) CAPS; Take 5,000 Units by mouth daily.  Dispense: 30 capsule; Refill: 0. We will recheck labs in 2 months, and will continue to monitor.  4. Class 3 severe obesity with serious comorbidity and body mass index (BMI) of 50.0 to 59.9 in adult, unspecified obesity type ([place]) Jodi Ballard is currently in the action stage of change. As such, her goal is to continue with weight loss efforts. She has agreed to on the Category 2 Plan.   We discussed the following exercise goals today: For substantial health benefits, adults should do at least 150 minutes (2 hours and 30 minutes) a week of moderate-intensity, or 75 minutes (1 hour and 15 minutes) a week of vigorous-intensity aerobic physical activity, or an equivalent combination of  moderate- and vigorous-intensity aerobic activity. Aerobic activity should be performed in episodes of at least 10 minutes, and preferably, it should be spread throughout the week. Adults should also include muscle-strengthening activities that involve all major muscle  groups on 2 or more days a week.  We discussed the following behavioral modification strategies today: meal planning and cooking strategies.  Jodi Ballard has agreed to follow-up with our clinic in 3 weeks. She was informed of the importance of frequent follow-up visits to maximize her success with intensive lifestyle modifications for her multiple health conditions.  Objective:   VITALS: Per patient if applicable, see vitals. GENERAL: Alert and in no acute distress. CARDIOPULMONARY: No increased WOB. Speaking in clear sentences.  PSYCH: Pleasant and cooperative. Speech normal rate and rhythm. Affect is appropriate. Insight and judgement are appropriate. Attention is focused, linear, and appropriate.  NEURO: Oriented as arrived to appointment on time with no prompting.   Lab Results  Component Value Date   CREATININE 0.74 03/11/2019   BUN 13 03/11/2019   NA 141 03/11/2019   K 4.5 03/11/2019   CL 104 03/11/2019   CO2 23 03/11/2019   Lab Results  Component Value Date   ALT 17 03/11/2019   AST 15 03/11/2019   ALKPHOS 113 03/11/2019   BILITOT 0.4 03/11/2019   Lab Results  Component Value Date   HGBA1C 5.1 03/11/2019   HGBA1C 5.2 10/08/2018   Lab Results  Component Value Date   INSULIN 15.8 03/11/2019   INSULIN 18.1 10/08/2018   Lab Results  Component Value Date   TSH 2.030 10/08/2018   Lab Results  Component Value Date   CHOL 201 (H) 03/11/2019   HDL 42 03/11/2019   LDLCALC 126 (H) 03/11/2019   TRIG 188 (H) 03/11/2019   Lab Results  Component Value Date   WBC 6.1 10/08/2018   HGB 14.3 10/08/2018   HCT 43.3 10/08/2018   MCV 86 10/08/2018   PLT 271 07/02/2016   No results found for: IRON, TIBC, FERRITIN  Attestation Statements:   Reviewed by clinician on day of visit: allergies, medications, problem list, medical history, surgical history, family history, social history and previous encounter notes.  I, Burt Knack, am acting as transcriptionist for  Quillian Quince, MD.  I have reviewed the above documentation for accuracy and completeness, and I agree with the above. - Quillian Quince, MD

## 2019-04-15 ENCOUNTER — Ambulatory Visit (INDEPENDENT_AMBULATORY_CARE_PROVIDER_SITE_OTHER): Payer: BC Managed Care – PPO | Admitting: Professional

## 2019-04-15 DIAGNOSIS — F4310 Post-traumatic stress disorder, unspecified: Secondary | ICD-10-CM

## 2019-04-22 ENCOUNTER — Ambulatory Visit (INDEPENDENT_AMBULATORY_CARE_PROVIDER_SITE_OTHER): Payer: BC Managed Care – PPO | Admitting: Professional

## 2019-04-22 DIAGNOSIS — F331 Major depressive disorder, recurrent, moderate: Secondary | ICD-10-CM | POA: Diagnosis not present

## 2019-04-22 DIAGNOSIS — F411 Generalized anxiety disorder: Secondary | ICD-10-CM

## 2019-04-29 ENCOUNTER — Ambulatory Visit (INDEPENDENT_AMBULATORY_CARE_PROVIDER_SITE_OTHER): Payer: BC Managed Care – PPO | Admitting: Professional

## 2019-04-29 DIAGNOSIS — F411 Generalized anxiety disorder: Secondary | ICD-10-CM | POA: Diagnosis not present

## 2019-04-29 DIAGNOSIS — F331 Major depressive disorder, recurrent, moderate: Secondary | ICD-10-CM | POA: Diagnosis not present

## 2019-04-30 ENCOUNTER — Telehealth (INDEPENDENT_AMBULATORY_CARE_PROVIDER_SITE_OTHER): Payer: BC Managed Care – PPO | Admitting: Family Medicine

## 2019-05-01 ENCOUNTER — Encounter (INDEPENDENT_AMBULATORY_CARE_PROVIDER_SITE_OTHER): Payer: Self-pay | Admitting: Family Medicine

## 2019-05-01 ENCOUNTER — Other Ambulatory Visit (INDEPENDENT_AMBULATORY_CARE_PROVIDER_SITE_OTHER): Payer: Self-pay

## 2019-05-01 DIAGNOSIS — E559 Vitamin D deficiency, unspecified: Secondary | ICD-10-CM

## 2019-05-01 MED ORDER — VITAMIN D 125 MCG (5000 UT) PO CAPS: 5000.0000 [IU] | ORAL_CAPSULE | Freq: Every day | ORAL | 0 refills | Status: AC

## 2019-05-05 ENCOUNTER — Ambulatory Visit: Payer: BC Managed Care – PPO | Admitting: Family Medicine

## 2019-05-05 ENCOUNTER — Other Ambulatory Visit: Payer: Self-pay

## 2019-05-05 ENCOUNTER — Encounter: Payer: Self-pay | Admitting: Family Medicine

## 2019-05-05 VITALS — BP 102/78 | HR 106 | Temp 96.8°F | Ht 62.0 in | Wt 290.5 lb

## 2019-05-05 DIAGNOSIS — R609 Edema, unspecified: Secondary | ICD-10-CM | POA: Diagnosis not present

## 2019-05-05 DIAGNOSIS — E8881 Metabolic syndrome: Secondary | ICD-10-CM

## 2019-05-05 MED ORDER — METFORMIN HCL 500 MG PO TABS: 500.0000 mg | ORAL_TABLET | Freq: Every day | ORAL | 0 refills | Status: DC

## 2019-05-05 NOTE — Progress Notes (Signed)
This visit occurred during the SARS-CoV-2 public health emergency.  Safety protocols were in place, including screening questions prior to the visit, additional usage of staff PPE, and extensive cleaning of exam room while observing appropriate contact time as indicated for disinfecting solutions.  Swelling on the R side of jaw.  Pulse elevation likely related to pain.  Sx noted yesterday.  R parotid swelling.  No L sided sx.  No FCNAVD.  No rash.  No vision loss.  No rhinorrhea.  No ST.  She feels well except for local pain.  No trauma.  No trigger.  Sore with opening her mouth but didn't hurt to chew dinner last night.  No sx like this prev. No tooth pain.    She has routine childhood vaccinations, ie MMR, d/w pt.   She has nail polish that likely affects her pulse ox. She is working on weight loss with weight program.   She is out of metformin the meantime. I filled it in the meantime.  She'll check with outside clinic for other refills.    Meds, vitals, and allergies reviewed.   ROS: Per HPI unless specifically indicated in ROS section   nad ncat except for right parotid swelling without erythema. Old cracked R lower molar but no acute changes in the OP otherwise. R parotid swelling w/o erythema or warmth or LA in the neck TMs wnl.  Left parotid, bilateral sublingual and bilateral submandibular salivary glands not enlarged.

## 2019-05-05 NOTE — Patient Instructions (Signed)
Likely a salivary duct stone.  Take ibuprofen with food.  Drink plenty of water, use warm compresses, and use sour candy.  If not better, then we can set you up with ENT.  I expect this to resolve.  Take care.  Glad to see you.

## 2019-05-06 ENCOUNTER — Ambulatory Visit (INDEPENDENT_AMBULATORY_CARE_PROVIDER_SITE_OTHER): Payer: BC Managed Care – PPO | Admitting: Professional

## 2019-05-06 DIAGNOSIS — F411 Generalized anxiety disorder: Secondary | ICD-10-CM

## 2019-05-06 DIAGNOSIS — F331 Major depressive disorder, recurrent, moderate: Secondary | ICD-10-CM | POA: Diagnosis not present

## 2019-05-07 DIAGNOSIS — R609 Edema, unspecified: Secondary | ICD-10-CM | POA: Insufficient documentation

## 2019-05-07 NOTE — Assessment & Plan Note (Signed)
Discussed options.  No stridor.  Still okay for outpatient follow-up. Likely a salivary duct stone.  Does not appear to be infected. Take ibuprofen with food.  Drink plenty of water, use warm compresses, and use sour candy.  If not better, then we can set her up with ENT.  I expect this to resolve.  She will update me as needed.

## 2019-05-08 ENCOUNTER — Encounter: Payer: Self-pay | Admitting: Family Medicine

## 2019-05-08 DIAGNOSIS — R609 Edema, unspecified: Secondary | ICD-10-CM

## 2019-05-08 NOTE — Telephone Encounter (Signed)
Please call pt.  I would like her to see ENT.  I put in the referral.  Thanks.

## 2019-05-09 ENCOUNTER — Other Ambulatory Visit: Payer: Self-pay | Admitting: Otolaryngology

## 2019-05-09 DIAGNOSIS — R221 Localized swelling, mass and lump, neck: Secondary | ICD-10-CM

## 2019-05-09 DIAGNOSIS — K1121 Acute sialoadenitis: Secondary | ICD-10-CM | POA: Diagnosis not present

## 2019-05-13 ENCOUNTER — Ambulatory Visit (INDEPENDENT_AMBULATORY_CARE_PROVIDER_SITE_OTHER): Payer: BC Managed Care – PPO | Admitting: Professional

## 2019-05-13 DIAGNOSIS — F331 Major depressive disorder, recurrent, moderate: Secondary | ICD-10-CM | POA: Diagnosis not present

## 2019-05-13 DIAGNOSIS — F411 Generalized anxiety disorder: Secondary | ICD-10-CM | POA: Diagnosis not present

## 2019-05-14 ENCOUNTER — Telehealth (INDEPENDENT_AMBULATORY_CARE_PROVIDER_SITE_OTHER): Payer: BC Managed Care – PPO | Admitting: Family Medicine

## 2019-05-14 ENCOUNTER — Other Ambulatory Visit: Payer: Self-pay

## 2019-05-14 ENCOUNTER — Encounter (INDEPENDENT_AMBULATORY_CARE_PROVIDER_SITE_OTHER): Payer: Self-pay | Admitting: Family Medicine

## 2019-05-14 DIAGNOSIS — E559 Vitamin D deficiency, unspecified: Secondary | ICD-10-CM | POA: Diagnosis not present

## 2019-05-14 DIAGNOSIS — Z6841 Body Mass Index (BMI) 40.0 and over, adult: Secondary | ICD-10-CM | POA: Diagnosis not present

## 2019-05-14 NOTE — Progress Notes (Signed)
TeleHealth Visit:  Due to the COVID-19 pandemic, this visit was completed with telemedicine (audio/video) technology to reduce patient and provider exposure as well as to preserve personal protective equipment.   Jodi Ballard has verbally consented to this TeleHealth visit. The patient is located at home, the provider is located at the [employer] [facility] office. The participants in this visit include the listed provider and patient. The visit was conducted today via doxy.me.   Chief Complaint: OBESITY Jodi Ballard is here to discuss her progress with her obesity treatment plan along with follow-up of her obesity related diagnoses. Jodi Ballard is on the Category 2 Plan and states she is following her eating plan approximately 80% of the time. Jodi Ballard states she is walking for 30-45 minutes 3 times per week.  Today's visit was #: 12 Starting weight: 305 lbs Starting date: 10/08/2018  Interim History: Jodi Ballard feels she is doing well on her Category 2 plan. She hasn't weighed but her clothes fit more loosely. She feels better overall and she has done well avoiding emotional eating as much. She is sleeping better too.  Subjective:   1. Vitamin D deficiency Jodi Ballard's Vit D level is not yet at goal with her last labs.  Assessment/Plan:   1. Vitamin D deficiency Low Vitamin D level contributes to fatigue and are associated with obesity, breast, and colon cancer. Jodi Ballard agreed to continue taking OTC Vit D 5,000 IU daily and will follow-up for routine testing of Vitamin D, at least 2-3 times per year to avoid over-replacement. We will recheck labs in 1 month.  2. Class 3 severe obesity with serious comorbidity and body mass index (BMI) of 50.0 to 59.9 in adult, unspecified obesity type ([facility]) Jodi Ballard is currently in the action stage of change. As such, her goal is to continue with weight loss efforts. She has agreed to the Category 2 Plan.   Behavioral modification strategies: increasing lean protein  intake.  Jodi Ballard has agreed to follow-up with our clinic in 2 weeks with myself. She was informed of the importance of frequent follow-up visits to maximize her success with intensive lifestyle modifications for her multiple health conditions.  Objective:   VITALS: Per patient if applicable, see vitals. GENERAL: Alert and in no acute distress. CARDIOPULMONARY: No increased WOB. Speaking in clear sentences.  PSYCH: Pleasant and cooperative. Speech normal rate and rhythm. Affect is appropriate. Insight and judgement are appropriate. Attention is focused, linear, and appropriate.  NEURO: Oriented as arrived to appointment on time with no prompting.   Lab Results  Component Value Date   CREATININE 0.74 03/11/2019   BUN 13 03/11/2019   NA 141 03/11/2019   K 4.5 03/11/2019   CL 104 03/11/2019   CO2 23 03/11/2019   Lab Results  Component Value Date   ALT 17 03/11/2019   AST 15 03/11/2019   ALKPHOS 113 03/11/2019   BILITOT 0.4 03/11/2019   Lab Results  Component Value Date   HGBA1C 5.1 03/11/2019   HGBA1C 5.2 10/08/2018   Lab Results  Component Value Date   INSULIN 15.8 03/11/2019   INSULIN 18.1 10/08/2018   Lab Results  Component Value Date   TSH 2.030 10/08/2018   Lab Results  Component Value Date   CHOL 201 (H) 03/11/2019   HDL 42 03/11/2019   LDLCALC 126 (H) 03/11/2019   TRIG 188 (H) 03/11/2019   Lab Results  Component Value Date   WBC 6.1 10/08/2018   HGB 14.3 10/08/2018   HCT 43.3 10/08/2018  MCV 86 10/08/2018   PLT 271 07/02/2016   No results found for: IRON, TIBC, FERRITIN  Attestation Statements:   Reviewed by clinician on day of visit: allergies, medications, problem list, medical history, surgical history, family history, social history, and previous encounter notes.  Time spent on visit including pre-visit chart review and post-visit care was 20 minutes.    I, Trixie Dredge, am acting as transcriptionist for Dennard Nip, MD.  I have reviewed  the above documentation for accuracy and completeness, and I agree with the above. - Dennard Nip, MD

## 2019-05-20 ENCOUNTER — Ambulatory Visit (INDEPENDENT_AMBULATORY_CARE_PROVIDER_SITE_OTHER): Payer: BC Managed Care – PPO | Admitting: Professional

## 2019-05-20 DIAGNOSIS — F411 Generalized anxiety disorder: Secondary | ICD-10-CM | POA: Diagnosis not present

## 2019-05-20 DIAGNOSIS — F331 Major depressive disorder, recurrent, moderate: Secondary | ICD-10-CM | POA: Diagnosis not present

## 2019-05-21 ENCOUNTER — Ambulatory Visit: Payer: BC Managed Care – PPO

## 2019-05-26 ENCOUNTER — Other Ambulatory Visit (INDEPENDENT_AMBULATORY_CARE_PROVIDER_SITE_OTHER): Payer: Self-pay | Admitting: Family Medicine

## 2019-05-26 DIAGNOSIS — E559 Vitamin D deficiency, unspecified: Secondary | ICD-10-CM

## 2019-05-27 ENCOUNTER — Ambulatory Visit (INDEPENDENT_AMBULATORY_CARE_PROVIDER_SITE_OTHER): Payer: BC Managed Care – PPO | Admitting: Professional

## 2019-05-27 DIAGNOSIS — F411 Generalized anxiety disorder: Secondary | ICD-10-CM

## 2019-05-27 DIAGNOSIS — F331 Major depressive disorder, recurrent, moderate: Secondary | ICD-10-CM | POA: Diagnosis not present

## 2019-05-28 ENCOUNTER — Telehealth (INDEPENDENT_AMBULATORY_CARE_PROVIDER_SITE_OTHER): Payer: BC Managed Care – PPO | Admitting: Family Medicine

## 2019-05-30 ENCOUNTER — Other Ambulatory Visit: Payer: Self-pay | Admitting: Family Medicine

## 2019-05-30 DIAGNOSIS — I471 Supraventricular tachycardia: Secondary | ICD-10-CM

## 2019-06-02 ENCOUNTER — Ambulatory Visit (INDEPENDENT_AMBULATORY_CARE_PROVIDER_SITE_OTHER): Payer: BC Managed Care – PPO | Admitting: Professional

## 2019-06-02 DIAGNOSIS — F411 Generalized anxiety disorder: Secondary | ICD-10-CM

## 2019-06-02 DIAGNOSIS — F331 Major depressive disorder, recurrent, moderate: Secondary | ICD-10-CM

## 2019-06-03 ENCOUNTER — Ambulatory Visit: Payer: BC Managed Care – PPO | Admitting: Professional

## 2019-06-03 DIAGNOSIS — Z20822 Contact with and (suspected) exposure to covid-19: Secondary | ICD-10-CM | POA: Diagnosis not present

## 2019-06-08 ENCOUNTER — Other Ambulatory Visit (INDEPENDENT_AMBULATORY_CARE_PROVIDER_SITE_OTHER): Payer: Self-pay | Admitting: Family Medicine

## 2019-06-08 DIAGNOSIS — E559 Vitamin D deficiency, unspecified: Secondary | ICD-10-CM

## 2019-06-10 ENCOUNTER — Ambulatory Visit (INDEPENDENT_AMBULATORY_CARE_PROVIDER_SITE_OTHER): Payer: BC Managed Care – PPO | Admitting: Professional

## 2019-06-10 DIAGNOSIS — F411 Generalized anxiety disorder: Secondary | ICD-10-CM

## 2019-06-10 DIAGNOSIS — F331 Major depressive disorder, recurrent, moderate: Secondary | ICD-10-CM

## 2019-06-17 ENCOUNTER — Ambulatory Visit: Payer: BC Managed Care – PPO | Admitting: Professional

## 2019-06-24 ENCOUNTER — Ambulatory Visit (INDEPENDENT_AMBULATORY_CARE_PROVIDER_SITE_OTHER): Payer: BC Managed Care – PPO | Admitting: Professional

## 2019-06-24 DIAGNOSIS — F411 Generalized anxiety disorder: Secondary | ICD-10-CM

## 2019-06-24 DIAGNOSIS — F331 Major depressive disorder, recurrent, moderate: Secondary | ICD-10-CM | POA: Diagnosis not present

## 2019-07-01 ENCOUNTER — Ambulatory Visit (INDEPENDENT_AMBULATORY_CARE_PROVIDER_SITE_OTHER): Payer: BC Managed Care – PPO | Admitting: Professional

## 2019-07-01 DIAGNOSIS — F411 Generalized anxiety disorder: Secondary | ICD-10-CM

## 2019-07-01 DIAGNOSIS — F331 Major depressive disorder, recurrent, moderate: Secondary | ICD-10-CM | POA: Diagnosis not present

## 2019-07-08 ENCOUNTER — Ambulatory Visit (INDEPENDENT_AMBULATORY_CARE_PROVIDER_SITE_OTHER): Payer: BC Managed Care – PPO | Admitting: Professional

## 2019-07-08 DIAGNOSIS — F331 Major depressive disorder, recurrent, moderate: Secondary | ICD-10-CM | POA: Diagnosis not present

## 2019-07-08 DIAGNOSIS — F411 Generalized anxiety disorder: Secondary | ICD-10-CM | POA: Diagnosis not present

## 2019-07-15 ENCOUNTER — Ambulatory Visit: Payer: BC Managed Care – PPO | Admitting: Professional

## 2019-07-22 ENCOUNTER — Telehealth (INDEPENDENT_AMBULATORY_CARE_PROVIDER_SITE_OTHER): Payer: BC Managed Care – PPO | Admitting: Family Medicine

## 2019-07-22 ENCOUNTER — Ambulatory Visit (INDEPENDENT_AMBULATORY_CARE_PROVIDER_SITE_OTHER): Payer: BC Managed Care – PPO | Admitting: Professional

## 2019-07-22 ENCOUNTER — Encounter: Payer: Self-pay | Admitting: Family Medicine

## 2019-07-22 DIAGNOSIS — F331 Major depressive disorder, recurrent, moderate: Secondary | ICD-10-CM

## 2019-07-22 DIAGNOSIS — J019 Acute sinusitis, unspecified: Secondary | ICD-10-CM | POA: Diagnosis not present

## 2019-07-22 DIAGNOSIS — F411 Generalized anxiety disorder: Secondary | ICD-10-CM

## 2019-07-22 MED ORDER — AMOXICILLIN-POT CLAVULANATE 875-125 MG PO TABS: 1.0000 | ORAL_TABLET | Freq: Two times a day (BID) | ORAL | 0 refills | Status: DC

## 2019-07-22 NOTE — Progress Notes (Signed)
Interactive audio and video telecommunications were attempted between this provider and patient, however failed, due to patient having technical difficulties OR patient did not have access to video capability.  We continued and completed visit with audio only.   Virtual Visit via Telephone Note  I connected with patient on 07/22/19  at 3:28 PM  by telephone and verified that I am speaking with the correct person using two identifiers.  Location of patient: home.    Location of MD: [facility] Name of referring provider (if blank then none associated): Names per persons and role in encounter:  MD: Ferd Hibbs, Patient: name listed above.    I discussed the limitations, risks, security and privacy concerns of performing an evaluation and management service by telephone and the availability of in person appointments. I also discussed with the patient that there may be a patient responsible charge related to this service. The patient expressed understanding and agreed to proceed.  CC: URI sx.   History of Present Illness: sx started last week.  She was out walking properties for work.  High pollen counts.  Taking tylenol and benadryl.  Post nasal gtt.  Teeth are hurting, B sides of the mouth.  Facial pressure, maxillary.  Fatigued.  No known covid exposure.  Taste and smell are still normal.  No fevers.  Dec in appetite.  Some cough.  No vomiting except for post tussive emesis a few times.  No blood in sputum.  Hot shower helps the facial pain.  Nasal saline helps.      Observations/Objective: nad Speech wnl.   Assessment and Plan:  Presumed sinusitis.  Still okay for outpatient follow-up.  Start augmentin and supportive care o/w.  She agrees.  She will update me as needed.  Follow Up Instructions: See above.   I discussed the assessment and treatment plan with the patient. The patient was provided an opportunity to ask questions and all were answered. The patient agreed with the  plan and demonstrated an understanding of the instructions.   The patient was advised to call back or seek an in-person evaluation if the symptoms worsen or if the condition fails to improve as anticipated.  I provided 10 minutes of non-face-to-face time during this encounter.   Crawford Givens, MD

## 2019-07-23 DIAGNOSIS — J019 Acute sinusitis, unspecified: Secondary | ICD-10-CM | POA: Insufficient documentation

## 2019-07-23 NOTE — Assessment & Plan Note (Signed)
Presumed sinusitis.  Still okay for outpatient follow-up.  Start augmentin and supportive care o/w.  She agrees.  She will update me as needed.

## 2019-07-29 ENCOUNTER — Ambulatory Visit: Payer: BC Managed Care – PPO | Admitting: Professional

## 2019-08-05 ENCOUNTER — Ambulatory Visit: Payer: BC Managed Care – PPO | Admitting: Professional

## 2019-08-10 ENCOUNTER — Other Ambulatory Visit: Payer: Self-pay | Admitting: Family Medicine

## 2019-08-10 DIAGNOSIS — E8881 Metabolic syndrome: Secondary | ICD-10-CM

## 2019-08-12 ENCOUNTER — Ambulatory Visit: Payer: BC Managed Care – PPO | Admitting: Professional

## 2019-08-12 ENCOUNTER — Ambulatory Visit (INDEPENDENT_AMBULATORY_CARE_PROVIDER_SITE_OTHER): Payer: BC Managed Care – PPO | Admitting: Professional

## 2019-08-12 DIAGNOSIS — F341 Dysthymic disorder: Secondary | ICD-10-CM | POA: Diagnosis not present

## 2019-08-19 ENCOUNTER — Ambulatory Visit (INDEPENDENT_AMBULATORY_CARE_PROVIDER_SITE_OTHER): Payer: BC Managed Care – PPO | Admitting: Professional

## 2019-08-19 DIAGNOSIS — F341 Dysthymic disorder: Secondary | ICD-10-CM | POA: Diagnosis not present

## 2019-08-26 ENCOUNTER — Ambulatory Visit (INDEPENDENT_AMBULATORY_CARE_PROVIDER_SITE_OTHER): Payer: BC Managed Care – PPO | Admitting: Professional

## 2019-08-26 DIAGNOSIS — F331 Major depressive disorder, recurrent, moderate: Secondary | ICD-10-CM | POA: Diagnosis not present

## 2019-08-26 DIAGNOSIS — F411 Generalized anxiety disorder: Secondary | ICD-10-CM | POA: Diagnosis not present

## 2019-09-02 ENCOUNTER — Ambulatory Visit (INDEPENDENT_AMBULATORY_CARE_PROVIDER_SITE_OTHER): Payer: BC Managed Care – PPO | Admitting: Professional

## 2019-09-02 DIAGNOSIS — F411 Generalized anxiety disorder: Secondary | ICD-10-CM

## 2019-09-02 DIAGNOSIS — F331 Major depressive disorder, recurrent, moderate: Secondary | ICD-10-CM

## 2019-09-09 ENCOUNTER — Ambulatory Visit (INDEPENDENT_AMBULATORY_CARE_PROVIDER_SITE_OTHER): Payer: BC Managed Care – PPO | Admitting: Professional

## 2019-09-09 DIAGNOSIS — F331 Major depressive disorder, recurrent, moderate: Secondary | ICD-10-CM

## 2019-09-09 DIAGNOSIS — F411 Generalized anxiety disorder: Secondary | ICD-10-CM

## 2019-09-16 ENCOUNTER — Ambulatory Visit: Payer: BC Managed Care – PPO | Admitting: Professional

## 2019-09-23 ENCOUNTER — Ambulatory Visit (INDEPENDENT_AMBULATORY_CARE_PROVIDER_SITE_OTHER): Payer: BC Managed Care – PPO | Admitting: Professional

## 2019-09-23 DIAGNOSIS — F4310 Post-traumatic stress disorder, unspecified: Secondary | ICD-10-CM

## 2019-09-29 ENCOUNTER — Telehealth: Payer: Self-pay | Admitting: Family Medicine

## 2019-09-29 NOTE — Telephone Encounter (Signed)
Pt called and is wanting to know what Dr Para March thinks about the [facility] program. She is wanting to start doing this program but was advised by the Program to get PCP approval d/t her medications. Two main concerning medications are the Metformin and Metoprolol. Pt wants to know if he feels she would actually benefit from this program or if she needs to try something else.   Please advise, thanks.

## 2019-09-30 ENCOUNTER — Ambulatory Visit: Payer: BC Managed Care – PPO | Admitting: Professional

## 2019-09-30 NOTE — Telephone Encounter (Signed)
Patient advised.

## 2019-09-30 NOTE — Telephone Encounter (Signed)
I do not normally tell patients to start a program like this.  It is usually cheaper for patients to do their own meal prep based on routine nutrition guidelines.  I would not advise her to stop the Metformin or metoprolol at this point unless she was having low sugar or low blood pressure/pulse respectively.

## 2019-10-01 ENCOUNTER — Ambulatory Visit (INDEPENDENT_AMBULATORY_CARE_PROVIDER_SITE_OTHER): Payer: BC Managed Care – PPO | Admitting: Professional

## 2019-10-01 DIAGNOSIS — F331 Major depressive disorder, recurrent, moderate: Secondary | ICD-10-CM

## 2019-10-01 DIAGNOSIS — F411 Generalized anxiety disorder: Secondary | ICD-10-CM

## 2019-10-14 ENCOUNTER — Ambulatory Visit (INDEPENDENT_AMBULATORY_CARE_PROVIDER_SITE_OTHER): Payer: BC Managed Care – PPO | Admitting: Professional

## 2019-10-14 DIAGNOSIS — F331 Major depressive disorder, recurrent, moderate: Secondary | ICD-10-CM | POA: Diagnosis not present

## 2019-10-21 ENCOUNTER — Ambulatory Visit (INDEPENDENT_AMBULATORY_CARE_PROVIDER_SITE_OTHER): Payer: BC Managed Care – PPO | Admitting: Professional

## 2019-10-21 DIAGNOSIS — F331 Major depressive disorder, recurrent, moderate: Secondary | ICD-10-CM | POA: Diagnosis not present

## 2019-10-21 DIAGNOSIS — F411 Generalized anxiety disorder: Secondary | ICD-10-CM

## 2019-10-28 ENCOUNTER — Ambulatory Visit (INDEPENDENT_AMBULATORY_CARE_PROVIDER_SITE_OTHER): Payer: BC Managed Care – PPO | Admitting: Professional

## 2019-10-28 DIAGNOSIS — F411 Generalized anxiety disorder: Secondary | ICD-10-CM | POA: Diagnosis not present

## 2019-10-28 DIAGNOSIS — F331 Major depressive disorder, recurrent, moderate: Secondary | ICD-10-CM | POA: Diagnosis not present

## 2019-11-04 ENCOUNTER — Ambulatory Visit (INDEPENDENT_AMBULATORY_CARE_PROVIDER_SITE_OTHER): Payer: BC Managed Care – PPO | Admitting: Professional

## 2019-11-04 DIAGNOSIS — F331 Major depressive disorder, recurrent, moderate: Secondary | ICD-10-CM

## 2019-11-04 DIAGNOSIS — F411 Generalized anxiety disorder: Secondary | ICD-10-CM

## 2019-11-20 ENCOUNTER — Ambulatory Visit: Payer: BC Managed Care – PPO | Admitting: Professional

## 2019-11-25 ENCOUNTER — Ambulatory Visit (INDEPENDENT_AMBULATORY_CARE_PROVIDER_SITE_OTHER): Payer: BC Managed Care – PPO | Admitting: Professional

## 2019-11-25 DIAGNOSIS — F331 Major depressive disorder, recurrent, moderate: Secondary | ICD-10-CM

## 2019-11-25 DIAGNOSIS — F411 Generalized anxiety disorder: Secondary | ICD-10-CM

## 2019-12-09 ENCOUNTER — Ambulatory Visit (INDEPENDENT_AMBULATORY_CARE_PROVIDER_SITE_OTHER): Payer: BC Managed Care – PPO | Admitting: Professional

## 2019-12-09 DIAGNOSIS — F331 Major depressive disorder, recurrent, moderate: Secondary | ICD-10-CM

## 2019-12-09 DIAGNOSIS — F411 Generalized anxiety disorder: Secondary | ICD-10-CM | POA: Diagnosis not present

## 2019-12-19 ENCOUNTER — Other Ambulatory Visit: Payer: Self-pay | Admitting: Family Medicine

## 2019-12-19 DIAGNOSIS — I471 Supraventricular tachycardia: Secondary | ICD-10-CM

## 2019-12-23 ENCOUNTER — Ambulatory Visit (INDEPENDENT_AMBULATORY_CARE_PROVIDER_SITE_OTHER): Payer: BC Managed Care – PPO | Admitting: Professional

## 2019-12-23 DIAGNOSIS — F331 Major depressive disorder, recurrent, moderate: Secondary | ICD-10-CM

## 2019-12-23 DIAGNOSIS — F411 Generalized anxiety disorder: Secondary | ICD-10-CM | POA: Diagnosis not present

## 2020-01-06 ENCOUNTER — Ambulatory Visit: Payer: Self-pay | Admitting: Professional

## 2020-01-08 ENCOUNTER — Ambulatory Visit (INDEPENDENT_AMBULATORY_CARE_PROVIDER_SITE_OTHER): Payer: BC Managed Care – PPO | Admitting: Professional

## 2020-01-08 DIAGNOSIS — F331 Major depressive disorder, recurrent, moderate: Secondary | ICD-10-CM | POA: Diagnosis not present

## 2020-01-08 DIAGNOSIS — F411 Generalized anxiety disorder: Secondary | ICD-10-CM

## 2020-01-19 DIAGNOSIS — Z1231 Encounter for screening mammogram for malignant neoplasm of breast: Secondary | ICD-10-CM | POA: Diagnosis not present

## 2020-01-19 LAB — HM MAMMOGRAPHY

## 2020-01-20 ENCOUNTER — Ambulatory Visit: Payer: Self-pay | Admitting: Professional

## 2020-01-22 ENCOUNTER — Ambulatory Visit (INDEPENDENT_AMBULATORY_CARE_PROVIDER_SITE_OTHER): Payer: BC Managed Care – PPO | Admitting: Professional

## 2020-01-22 DIAGNOSIS — F411 Generalized anxiety disorder: Secondary | ICD-10-CM | POA: Diagnosis not present

## 2020-01-22 DIAGNOSIS — F331 Major depressive disorder, recurrent, moderate: Secondary | ICD-10-CM

## 2020-01-23 ENCOUNTER — Encounter: Payer: Self-pay | Admitting: Family Medicine

## 2020-02-03 ENCOUNTER — Ambulatory Visit: Payer: Self-pay | Admitting: Professional

## 2020-02-05 ENCOUNTER — Ambulatory Visit (INDEPENDENT_AMBULATORY_CARE_PROVIDER_SITE_OTHER): Payer: BC Managed Care – PPO | Admitting: Professional

## 2020-02-05 DIAGNOSIS — F411 Generalized anxiety disorder: Secondary | ICD-10-CM

## 2020-02-05 DIAGNOSIS — F331 Major depressive disorder, recurrent, moderate: Secondary | ICD-10-CM

## 2020-02-09 ENCOUNTER — Telehealth: Payer: Self-pay | Admitting: *Deleted

## 2020-02-09 NOTE — Telephone Encounter (Signed)
See note from R Laws LPN in this phone note and pt already has appt with Dr Para March 02/10/20 at 10:30.

## 2020-02-09 NOTE — Telephone Encounter (Signed)
[facility] Day - Client TELEPHONE ADVICE RECORD [facility] Patient Name: Jodi Ballard Gender: Female DOB: [birth year] Age: 54 Y 5 M 26 D Return Phone Number: [phone] (Primary) Address: City/State/Zip: Jodi [place] [address] Client [facility] Day - Client Client Site [facility] - Day Contact Type Call Who Is Calling Patient / Member / Family / Caregiver Call Type Triage / Clinical Relationship To Patient Self Return Phone Number [phone] (Primary) Chief Complaint CHEST PAIN (>=21 years) - pain, pressure, heaviness or tightness Reason for Call Symptomatic / Request for Health Information Initial Comment Robin with Dr. Para March office trans caller to be triaged caller states for the last 2wks she's been having funky chest pains -- she thought it was gas but not the case. Translation No Nurse Assessment Nurse: Doylene Canard, RN, Lesa Date/Time (Eastern Time): 02/09/2020 12:20:17 PM Confirm and document reason for call. If symptomatic, describe symptoms. ---Caller states she as chest pain Does the patient have any new or worsening symptoms? ---Yes Will a triage be completed? ---Yes Related visit to physician within the last 2 weeks? ---No Does the PT have any chronic conditions? (i.e. diabetes, asthma, this includes High risk factors for pregnancy, etc.) ---No Is this a behavioral health or substance abuse call? ---No Guidelines Guideline Title Affirmed Question Affirmed Notes Nurse Date/Time Lamount Cohen Time) Chest Pain [1] Chest pain lasts < 5 minutes AND [2] NO chest pain or cardiac symptoms (e.g., breathing difficulty, sweating) now (Exception: chest pains that last only a few seconds) Conner, RN, Lesa 02/09/2020 12:20:49 PM Disp. Time Lamount Cohen Time) Disposition Final User 02/09/2020 12:18:33 PM Send to Urgent Michela Pitcher, Lanette 02/09/2020 12:25:33 PM See PCP within 24 Hours Yes Conner, RN, Lesa PLEASE  NOTE: All timestamps contained within this report are represented as [place] Standard Time. CONFIDENTIALTY NOTICE: This fax transmission is intended only for the addressee. It contains information that is legally privileged, confidential or otherwise protected from use or disclosure. If you are not the intended recipient, you are strictly prohibited from reviewing, disclosing, copying using or disseminating any of this information or taking any action in reliance on or regarding this information. If you have received this fax in error, please notify us immediately by telephone so that we can arrange for its return to [place]. Phone: [phone], Toll-Free: [phone], Fax: [phone] Page: 2 of 2 Call Id: [id number] Caller Disagree/Comply Comply Caller Understands Yes PreDisposition Did not know what to do Care Advice Given Per Guideline SEE PCP WITHIN 24 HOURS: * IF OFFICE WILL BE OPEN: You need to be examined within the next 24 hours. Call your doctor (or NP/PA) when the office opens and make an appointment. CALL BACK IF: * Difficulty breathing occurs * Chest pain increases in frequency, duration or severity * Chest pain lasts over 5 minutes * You become worse CARE ADVICE given per Chest Pain (Adult) guideline. Referrals REFERRED TO PCP OFFICE

## 2020-02-09 NOTE — Telephone Encounter (Signed)
Noted. Thanks.

## 2020-02-09 NOTE — Telephone Encounter (Signed)
Patient had called the office and was transferred to Access Nurse because of her chest feeling funny. Patient called the office back to schedule an appointment because the Access Nurse told her that she needed to be seen within 24 hours. Spoke to patient and was advised that she has had some chest pain and twinges across her chest off and on for about two weeks. Patient stated that sometimes the feeling is in the middle of her chest and other times from one nipple to the other. Patient stated that she has been dieting and eating better and to start with she thought it may be gas. Patient stated that her symptoms are just not getting any better. Patient denies any SOB or difficulty breathing. Patient stated that the symptoms occur even when she is sitting around doing nothing. Patient stated the pain/twinges occur off and on. Patient stated that this has been going on for about two weeks and feels that this can wait until tomorrow. After speaking with Dr. Para March it was confirmed that patient is not having chest pain with exertion. Patient scheduled to see Dr. Para March tomorrow 02/10/20 at 10:30 am.  Patient was given ER/911 precautions and she verbalized understanding. Patient had a negative covid screening.Marland Kitchen

## 2020-02-10 ENCOUNTER — Encounter: Payer: Self-pay | Admitting: Family Medicine

## 2020-02-10 ENCOUNTER — Other Ambulatory Visit: Payer: Self-pay

## 2020-02-10 ENCOUNTER — Ambulatory Visit: Payer: BC Managed Care – PPO | Admitting: Family Medicine

## 2020-02-10 VITALS — BP 126/80 | HR 103 | Temp 96.9°F | Ht 62.0 in | Wt 277.6 lb

## 2020-02-10 DIAGNOSIS — R0789 Other chest pain: Secondary | ICD-10-CM

## 2020-02-10 DIAGNOSIS — I471 Supraventricular tachycardia: Secondary | ICD-10-CM | POA: Diagnosis not present

## 2020-02-10 LAB — COMPREHENSIVE METABOLIC PANEL
ALT: 15 U/L (ref 0–35)
AST: 15 U/L (ref 0–37)
Albumin: 4 g/dL (ref 3.5–5.2)
Alkaline Phosphatase: 88 U/L (ref 39–117)
BUN: 18 mg/dL (ref 6–23)
CO2: 30 mEq/L (ref 19–32)
Calcium: 9.2 mg/dL (ref 8.4–10.5)
Chloride: 103 mEq/L (ref 96–112)
Creatinine, Ser: 0.78 mg/dL (ref 0.40–1.20)
GFR: 85.46 mL/min (ref >60.00)
Glucose, Bld: 79 mg/dL (ref 70–99)
Potassium: 4.1 mEq/L (ref 3.5–5.1)
Sodium: 139 mEq/L (ref 135–145)
Total Bilirubin: 0.4 mg/dL (ref 0.2–1.2)
Total Protein: 6.7 g/dL (ref 6.0–8.3)

## 2020-02-10 LAB — CBC WITH DIFFERENTIAL/PLATELET
Basophils Absolute: 0.1 10*3/uL (ref 0.0–0.1)
Basophils Relative: 1.3 % (ref 0.0–3.0)
Eosinophils Absolute: 0.3 10*3/uL (ref 0.0–0.7)
Eosinophils Relative: 4.4 % (ref 0.0–5.0)
HCT: 42.5 % (ref 36.0–46.0)
Hemoglobin: 14.3 g/dL (ref 12.0–15.0)
Lymphocytes Relative: 19.1 % (ref 12.0–46.0)
Lymphs Abs: 1.3 10*3/uL (ref 0.7–4.0)
MCHC: 33.5 g/dL (ref 30.0–36.0)
MCV: 85.6 fl (ref 78.0–100.0)
Monocytes Absolute: 0.6 10*3/uL (ref 0.1–1.0)
Monocytes Relative: 8.3 % (ref 3.0–12.0)
Neutro Abs: 4.6 10*3/uL (ref 1.4–7.7)
Neutrophils Relative %: 66.9 % (ref 43.0–77.0)
Platelets: 237 10*3/uL (ref 150.0–400.0)
RBC: 4.96 Mil/uL (ref 3.87–5.11)
RDW: 14.3 % (ref 11.5–15.5)
WBC: 6.8 10*3/uL (ref 4.0–10.5)

## 2020-02-10 LAB — TSH: TSH: 1.28 u[IU]/mL (ref 0.35–4.50)

## 2020-02-10 MED ORDER — METOPROLOL SUCCINATE ER 25 MG PO TB24: 12.5000 mg | ORAL_TABLET | Freq: Every day | ORAL | 1 refills | Status: DC

## 2020-02-10 MED ORDER — OMEPRAZOLE 20 MG PO CPDR: 20.0000 mg | DELAYED_RELEASE_CAPSULE | Freq: Every day | ORAL | Status: DC

## 2020-02-10 NOTE — Patient Instructions (Addendum)
Go to the lab on the way out.   If you have mychart we'll likely use that to update you.    Continue your regular meds but add on OTC prilosec 20mg  a day.  See if that helps.  If not then let me know.  We can refer you to cardiology if your symptoms persist.  If clearly worse, then update or call 911.   Take care.  Glad to see you.

## 2020-02-10 NOTE — Progress Notes (Signed)
This visit occurred during the SARS-CoV-2 public health emergency.  Safety protocols were in place, including screening questions prior to the visit, additional usage of staff PPE, and extensive cleaning of exam room while observing appropriate contact time as indicated for disinfecting solutions.  She has intentional weight loss.  She is working on diet and exercise. She clearly feels better with her weight loss.     She lost her job in August.  Her brother was diagnosed with cancer.  Her son had sig illness earlier this year.  He is currently home schooled. She has a lot going on.  D/w pt.    Sx started a few weeks ago.  Intermittent.  Still on beta blocker at baseline.  H/o tachycardia noted.  No new meds o/w.  Can have a twinge that radiates across the anterior chest.  Started on the L side, then moves to the R side quickly.  No known trigger.  No rash.  No FCNAVD. Could happen at rest.  Can exert w/o troubles.  Can happen standing or when laying down.  Not SOB.  Quick onset, quick resolution.  No burping.  Some occ heartburn.  No blood in stool.  No vomiting.  No taste changes.  No pain currently.    Still on metformin but no diarrhea.    Meds, vitals, and allergies reviewed.   ROS: Per HPI unless specifically indicated in ROS section   GEN: nad, alert and oriented HEENT: ncat NECK: supple w/o LA CV: rrr. PULM: ctab, no inc wob ABD: soft, +bs EXT: no edema SKIN: well perfused.

## 2020-02-12 NOTE — Assessment & Plan Note (Addendum)
No symptoms currently.  No exertional symptoms.  EKG within normal limits except for occasional PACs.  Discussed with patient about options.  She does have occasional heartburn.  Unclear if her symptoms are related to that, this is possible.  See notes on labs. Continue regular meds but add on OTC prilosec 20mg  a day.  She will see if that helps.  If not then she will let me know.  We can refer her to cardiology if her symptoms persist.  If clearly worse, then update or call 911.   She agrees with plan.  Discussed with patient about history of SVT.  Would continue metoprolol in the meantime.  PACs noted on EKG are benign appearing.

## 2020-02-17 ENCOUNTER — Ambulatory Visit: Payer: Self-pay | Admitting: Professional

## 2020-02-18 ENCOUNTER — Other Ambulatory Visit: Payer: Self-pay | Admitting: Family Medicine

## 2020-02-18 DIAGNOSIS — E8881 Metabolic syndrome: Secondary | ICD-10-CM

## 2020-02-18 NOTE — Telephone Encounter (Signed)
Pharmacy requests refill on: Metformin HCL 500 mg  LAST REFILL: 08/10/2019 LAST OV: 02/10/2020 NEXT OV: Not Scheduled  PHARMACY: [id number] [place], [place]   Last HgbA1C(03/11/2019): 5.1

## 2020-02-19 ENCOUNTER — Ambulatory Visit (INDEPENDENT_AMBULATORY_CARE_PROVIDER_SITE_OTHER): Payer: BC Managed Care – PPO | Admitting: Professional

## 2020-02-19 DIAGNOSIS — F331 Major depressive disorder, recurrent, moderate: Secondary | ICD-10-CM | POA: Diagnosis not present

## 2020-02-19 DIAGNOSIS — F411 Generalized anxiety disorder: Secondary | ICD-10-CM | POA: Diagnosis not present

## 2020-03-02 ENCOUNTER — Ambulatory Visit: Payer: Self-pay | Admitting: Professional

## 2020-03-04 ENCOUNTER — Ambulatory Visit: Payer: Self-pay | Admitting: Professional

## 2020-03-11 ENCOUNTER — Encounter: Payer: Self-pay | Admitting: Family Medicine

## 2020-03-16 ENCOUNTER — Ambulatory Visit: Payer: Self-pay | Admitting: Professional

## 2020-03-18 ENCOUNTER — Ambulatory Visit: Payer: BC Managed Care – PPO | Admitting: Professional

## 2020-03-30 ENCOUNTER — Ambulatory Visit: Payer: Self-pay | Admitting: Professional

## 2020-04-01 ENCOUNTER — Ambulatory Visit: Payer: Self-pay | Admitting: Professional

## 2020-04-13 ENCOUNTER — Ambulatory Visit: Payer: Self-pay | Admitting: Professional

## 2020-04-15 ENCOUNTER — Ambulatory Visit: Payer: Self-pay | Admitting: Professional

## 2020-04-27 ENCOUNTER — Ambulatory Visit: Payer: Self-pay | Admitting: Professional

## 2020-04-29 ENCOUNTER — Ambulatory Visit: Payer: Self-pay | Admitting: Professional

## 2020-05-13 ENCOUNTER — Ambulatory Visit: Payer: Self-pay | Admitting: Professional

## 2020-05-27 ENCOUNTER — Ambulatory Visit: Payer: Self-pay | Admitting: Professional

## 2020-06-06 ENCOUNTER — Other Ambulatory Visit: Payer: Self-pay | Admitting: Family Medicine

## 2020-06-06 DIAGNOSIS — E8881 Metabolic syndrome: Secondary | ICD-10-CM

## 2020-06-10 ENCOUNTER — Ambulatory Visit: Payer: Self-pay | Admitting: Professional

## 2020-06-24 ENCOUNTER — Encounter: Payer: Self-pay | Admitting: Family Medicine

## 2020-06-24 ENCOUNTER — Other Ambulatory Visit: Payer: Self-pay

## 2020-06-24 ENCOUNTER — Ambulatory Visit: Payer: BC Managed Care – PPO | Admitting: Family Medicine

## 2020-06-24 ENCOUNTER — Ambulatory Visit (INDEPENDENT_AMBULATORY_CARE_PROVIDER_SITE_OTHER)
Admission: RE | Admit: 2020-06-24 | Discharge: 2020-06-24 | Disposition: A | Discharge status: Home / Self Care | Payer: BC Managed Care – PPO | Source: Ambulatory Visit | Attending: Family Medicine | Admitting: Family Medicine

## 2020-06-24 VITALS — BP 112/80 | HR 106 | Temp 97.4°F | Ht 62.0 in | Wt 283.5 lb

## 2020-06-24 DIAGNOSIS — M25562 Pain in left knee: Secondary | ICD-10-CM

## 2020-06-24 DIAGNOSIS — M25462 Effusion, left knee: Secondary | ICD-10-CM | POA: Diagnosis not present

## 2020-06-24 DIAGNOSIS — M1712 Unilateral primary osteoarthritis, left knee: Secondary | ICD-10-CM | POA: Diagnosis not present

## 2020-06-24 NOTE — Progress Notes (Signed)
Jodi Hermiz T. Boone Gear, MD, CAQ Sports Medicine  Primary Care and Sports Medicine [facility] at [facility] [address] [place], [address]  Phone: [phone]   FAX: [phone]  Jodi Ballard - 55 y.o. female   MRN [id number]   Date of Birth: 08-Mar-1966  Date: 06/24/2020   PCP: Joaquim Nam, MD   Referral: Joaquim Nam, MD  Chief Complaint  Patient presents with   Knee Pain    Left-Pain, Swelling & Popping    This visit occurred during the SARS-CoV-2 public health emergency.  Safety protocols were in place, including screening questions prior to the visit, additional usage of staff PPE, and extensive cleaning of exam room while observing appropriate contact time as indicated for disinfecting solutions.   Subjective:   Jodi Ballard is a 55 y.o. very pleasant female patient with Body mass index is 51.85 kg/m. who presents with the following:  Left knee pain:  Severa - took recliner and something popped and startee swelling in the behind of the knee.   Most days it will pop.  Very nice young lady, and she presents after her left knee started to hurt when she was in a recliner and she felt something pop behind her left knee.  She has had some puffiness in the posterior aspect of the left knee and she thinks she is having some swelling.  She is having some popping, but she has not has had some sensation of locking.  She has not had any giving way.  She has no significant prior left knee history including no fractures or surgery.  She is able to walk without much significant difficulty.  She does not complain of other adjacent joint complaints.  Will get suck sometimes in a position.  Review of Systems is noted in the HPI, as appropriate   Objective:   BP 112/80    Pulse (!) 106    Temp (!) 97.4 F (36.3 C) (Temporal)    Ht 5\' 2"  (1.575 m)    Wt 283 lb 8 oz (128.6 kg)    LMP 02/01/2018 (Approximate)    SpO2 97%    BMI 51.85 kg/m   GEN:  No acute distress; alert,appropriate. PULM: Breathing comfortably in no respiratory distress PSYCH: Normally interactive.    Left knee: Full extension and flexion to 125.  She does have some patellar crepitus.  No significant pain with loading the medial lateral patellar facets.  Stable to varus and valgus stress.  Lachman and drawer tests are negative.  No significant effusion.  She does have some popliteal fullness.  She does have some tenderness in the region of the pes bursa.  Nontender at the quad and patellar tendon.  Tibia and fibula are entirely nontender.  Good quad strength.  Radiology: No results found.   Assessment and Plan:     ICD-10-CM   1. Acute pain of left knee  M25.562 DG Knee 4 Views W/Patella Left  2. Primary osteoarthritis of left knee  M17.12    On plain film, patient does have some mild medial compartmental osteoarthritis.  Posteriorly, there appears to be a fabella to me.  Clinically, most likely a small meniscal tear, but I cannot provoke any symptoms on exam the exception of extreme loading of the menisci on McMurray's.  Most likely she will do okay conservatively.  For now we are going to do basic conservative care including Tylenol, NSAIDs, icing, range of motion and topicals.  She is going to follow-up with me in 2 months if she is not improved.  Orders Placed This Encounter  Procedures   DG Knee 4 Views W/Patella Left    Signed,  Lima Chillemi T. Rafel Garde, MD   Outpatient Encounter Medications as of 06/24/2020  Medication Sig   B Complex Vitamins (VITAMIN B COMPLEX PO) Take 1 capsule by mouth as directed. Powder with water.   calcium carbonate (OS-CAL) 600 MG TABS tablet Take 600 mg by mouth 2 (two) times daily with a meal. Powder with water   Cholecalciferol (VITAMIN D) 125 MCG (5000 UT) CAPS Take 5,000 Units by mouth daily.   magnesium oxide (MAG-OX) 400 MG tablet Take 400 mg by mouth daily.   metFORMIN (GLUCOPHAGE) 500 MG tablet TAKE 1 TABLET BY  MOUTH EVERY DAY WITH BREAKFAST   metoprolol succinate (TOPROL-XL) 25 MG 24 hr tablet Take 0.5 tablets (12.5 mg total) by mouth daily.   Multiple Vitamins-Minerals (ANTIOXIDANT) CAPS Take by mouth.   Nutritional Supplements (ANTI-OXIDANT COMPLEX PO) Take 1 capsule by mouth daily. OPC3   Nutritional Supplements (NUTRITIONAL SUPPLEMENT PO) Feminene 1 tablet by mouth daily   omeprazole (PRILOSEC) 20 MG capsule Take 1 capsule (20 mg total) by mouth daily.   [DISCONTINUED] Vaginal Lubricant [facility]) Place vaginally.   No facility-administered encounter medications on file as of 06/24/2020.

## 2020-06-25 ENCOUNTER — Encounter: Payer: Self-pay | Admitting: Family Medicine

## 2020-09-16 ENCOUNTER — Other Ambulatory Visit: Payer: Self-pay | Admitting: Family Medicine

## 2020-09-16 DIAGNOSIS — E8881 Metabolic syndrome: Secondary | ICD-10-CM

## 2020-09-16 DIAGNOSIS — I471 Supraventricular tachycardia: Secondary | ICD-10-CM

## 2020-11-16 ENCOUNTER — Telehealth: Payer: Self-pay | Admitting: *Deleted

## 2020-11-16 ENCOUNTER — Telehealth: Payer: BC Managed Care – PPO | Admitting: Physician Assistant

## 2020-11-16 DIAGNOSIS — U071 COVID-19: Secondary | ICD-10-CM | POA: Diagnosis not present

## 2020-11-16 MED ORDER — MOLNUPIRAVIR EUA 200MG CAPSULE
4.0000 | ORAL_CAPSULE | Freq: Two times a day (BID) | ORAL | 0 refills | Status: AC
Start: 2020-11-16 — End: 2020-11-21

## 2020-11-16 MED ORDER — BENZONATATE 100 MG PO CAPS: 100.0000 mg | ORAL_CAPSULE | Freq: Three times a day (TID) | ORAL | 0 refills | Status: DC | PRN

## 2020-11-16 NOTE — Progress Notes (Signed)
Virtual Visit Consent   Jodi Ballard, you are scheduled for a virtual visit with a [facility] provider today.     Just as with appointments in the office, your consent must be obtained to participate.  Your consent will be active for this visit and any virtual visit you may have with one of our providers in the next 365 days.     If you have a MyChart account, a copy of this consent can be sent to you electronically.  All virtual visits are billed to your insurance company just like a traditional visit in the office.    As this is a virtual visit, video technology does not allow for your provider to perform a traditional examination.  This may limit your provider's ability to fully assess your condition.  If your provider identifies any concerns that need to be evaluated in person or the need to arrange testing (such as labs, EKG, etc.), we will make arrangements to do so.     Although advances in technology are sophisticated, we cannot ensure that it will always work on either your end or our end.  If the connection with a video visit is poor, the visit may have to be switched to a telephone visit.  With either a video or telephone visit, we are not always able to ensure that we have a secure connection.     I need to obtain your verbal consent now.   Are you willing to proceed with your visit today?    Jodi Ballard has provided verbal consent on 11/16/2020 for a virtual visit (video or telephone).   Piedad Climes, [place]   Date: 11/16/2020 5:28 PM   Virtual Visit via Video Note   I, Piedad Climes, connected with  Jodi Ballard  ([id number], 18-Jun-1965) on 11/16/20 at  5:15 PM EDT by a video-enabled telemedicine application and verified that I am speaking with the correct person using two identifiers.  Location: Patient: Virtual Visit Location Patient: Home Provider: Virtual Visit Location Provider: Home Office   I discussed the limitations of evaluation and  management by telemedicine and the availability of in person appointments. The patient expressed understanding and agreed to proceed.    History of Present Illness: Jodi Ballard is a 55 y.o. who identifies as a female who was assigned female at birth, and is being seen today for COVID-19.  Patient endorses symptom onset early Sunday morning with chills, nasal congestion, sinus pressure and fatigue. Notes substantial drainage. States she slept all day Sunday. As of last night, noting some voice hoarseness. Denies chest pain or SOB. Was tested Sunday afternoon for COVID due to symptoms and possible exposure from church members. Initially negative. Retested today and positive. Patient is not vaccinated. Is taking Mucinex to help with her symptoms. Has history of HTN, atrial tachycardia.   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  Now she can eat eggs w/o troubles except for photosensitivity after eating eggs   Medications:  Current Outpatient Medications:    benzonatate (TESSALON) 100 MG capsule, Take 1 capsule (100 mg total) by mouth 3 (three) times daily as needed for cough., Disp: 30 capsule, Rfl: 0   molnupiravir EUA 200 mg CAPS, Take 4 capsules (800 mg total) by mouth 2 (two) times daily for 5 days., Disp: 40  capsule, Rfl: 0   B Complex Vitamins (VITAMIN B COMPLEX PO), Take 1 capsule by mouth as directed. Powder with water., Disp: , Rfl:    calcium carbonate (OS-CAL) 600 MG TABS tablet, Take 600 mg by mouth 2 (two) times daily with a meal. Powder with water, Disp: , Rfl:    Cholecalciferol (VITAMIN D) 125 MCG (5000 UT) CAPS, Take 5,000 Units by mouth daily., Disp: 30 capsule, Rfl: 0   magnesium oxide (MAG-OX) 400 MG tablet, Take 400 mg by mouth daily., Disp: , Rfl:    metFORMIN (GLUCOPHAGE) 500 MG tablet, TAKE 1 TABLET BY MOUTH EVERY DAY WITH BREAKFAST, Disp: 30 tablet, Rfl: 2   metoprolol succinate (TOPROL-XL) 25 MG 24 hr tablet, TAKE 1/2 TABLET BY MOUTH EVERY DAY, Disp: 45 tablet, Rfl: 1   Multiple Vitamins-Minerals (ANTIOXIDANT) CAPS, Take by mouth., Disp: , Rfl:    Nutritional Supplements (ANTI-OXIDANT COMPLEX PO), Take 1 capsule by mouth daily. OPC3, Disp: , Rfl:    Nutritional Supplements (NUTRITIONAL SUPPLEMENT PO), Feminene 1 tablet by mouth daily, Disp: , Rfl:   Observations/Objective: Patient is well-developed, well-nourished in no acute distress.  Resting comfortably at home.  Head is normocephalic, atraumatic.  No labored breathing. Speech is clear and coherent with logical content.  Patient is alert and oriented at baseline.   Assessment and Plan: 1. COVID-19 - benzonatate (TESSALON) 100 MG capsule; Take 1 capsule (100 mg total) by mouth 3 (three) times daily as needed for cough.  Dispense: 30 capsule; Refill: 0 - molnupiravir EUA 200 mg CAPS; Take 4 capsules (800 mg total) by mouth 2 (two) times daily for 5 days.  Dispense: 40 capsule; Refill: 0 Patient with multiple risk factors for complicated course of illness. Discussed risks/benefits of antiviral medications including most common potential ADRs. Patient voiced understanding and would like to proceed with antiviral medication. They are candidate for molnupiravir. Rx sent to pharmacy. Supportive measures, OTC medications and  vitamin regimen reviewed. Tessalon per orders. Patient has been enrolled in a MyChart COVID symptom monitoring program. Anne Shutter reviewed in detail. Strict ER precautions discussed with patient.    Follow Up Instructions: I discussed the assessment and treatment plan with the patient. The patient was provided an opportunity to ask questions and all were answered. The patient agreed with the plan and demonstrated an understanding of the instructions.  A copy of instructions were sent to the patient via MyChart.  The patient was advised to call back or seek an in-person evaluation if the symptoms worsen or if the condition fails to improve as anticipated.  Time:  I spent 15 minutes with the patient via telehealth technology discussing the above problems/concerns.    Piedad Climes, PA-C

## 2020-11-16 NOTE — Patient Instructions (Signed)
Jodi Ballard, thank you for joining Jodi Climes, PA-C for today's virtual visit.  While this provider is not your primary care provider (PCP), if your PCP is located in our provider database this encounter information will be shared with them immediately following your visit.  Consent: (Patient) Jodi Ballard provided verbal consent for this virtual visit at the beginning of the encounter.  Current Medications:  Current Outpatient Medications:    B Complex Vitamins (VITAMIN B COMPLEX PO), Take 1 capsule by mouth as directed. Powder with water., Disp: , Rfl:    calcium carbonate (OS-CAL) 600 MG TABS tablet, Take 600 mg by mouth 2 (two) times daily with a meal. Powder with water, Disp: , Rfl:    Cholecalciferol (VITAMIN D) 125 MCG (5000 UT) CAPS, Take 5,000 Units by mouth daily., Disp: 30 capsule, Rfl: 0   magnesium oxide (MAG-OX) 400 MG tablet, Take 400 mg by mouth daily., Disp: , Rfl:    metFORMIN (GLUCOPHAGE) 500 MG tablet, TAKE 1 TABLET BY MOUTH EVERY DAY WITH BREAKFAST, Disp: 30 tablet, Rfl: 2   metoprolol succinate (TOPROL-XL) 25 MG 24 hr tablet, TAKE 1/2 TABLET BY MOUTH EVERY DAY, Disp: 45 tablet, Rfl: 1   Multiple Vitamins-Minerals (ANTIOXIDANT) CAPS, Take by mouth., Disp: , Rfl:    Nutritional Supplements (ANTI-OXIDANT COMPLEX PO), Take 1 capsule by mouth daily. OPC3, Disp: , Rfl:    Nutritional Supplements (NUTRITIONAL SUPPLEMENT PO), Feminene 1 tablet by mouth daily, Disp: , Rfl:    omeprazole (PRILOSEC) 20 MG capsule, Take 1 capsule (20 mg total) by mouth daily., Disp: , Rfl:    Medications ordered in this encounter:  No orders of the defined types were placed in this encounter.    *If you need refills on other medications prior to your next appointment, please contact your pharmacy*  Follow-Up: Call back or seek an in-person evaluation if the symptoms worsen or if the condition fails to improve as anticipated.  Other Instructions Please keep well-hydrated  and get plenty of rest. Start a saline nasal rinse to flush out your nasal passages. You can use plain Mucinex to help thin congestion. If you have a humidifier, running in the bedroom at night. I want you to start OTC vitamin D3 1000 units daily, vitamin C 1000 mg daily, and a zinc supplement. Please take prescribed medications as directed.  You have been enrolled in a MyChart symptom monitoring program. Please answer these questions daily so we can keep track of how you are doing.  You were to quarantine for 5 days from onset of your symptoms.  After day 5, if you have had no fever and you are feeling better, you can end quarantine but need to mask for an additional 5 days. After day 5 if you have a fever or are having significant symptoms, please quarantine for full 10 days.  If you note any worsening of symptoms, any significant shortness of breath or any chest pain, please seek ER evaluation ASAP.  Please do not delay care!    If you have been instructed to have an in-person evaluation today at a local Urgent Care facility, please use the link below. It will take you to a list of all of our available [facility], including address, phone number and hours of operation. Please do not delay care.  [facility]  If you or a family member do not have a primary care provider, use the link below to schedule a visit and  establish care. When you choose a [facility] primary care physician or advanced practice provider, you gain a long-term partner in health. Find a Primary Care Provider  Learn more about [facility] in-office and virtual care options: [facility] Get Care Now

## 2020-11-16 NOTE — Telephone Encounter (Signed)
Patient called stating that she has tested positive for covid. Patient stated that she did a test Sunday and it was negative. Patient stated that she did one Monday and Tuesday and both were positive. Patient stated that she has a cough, chills, headache and some chest tightness. Patient stated that she has taken mucinex and sinus medication. Patient stated that she has heard that there is a medication that can be given to make her feel better sooner. Patient was advised that she would need at least a virtual visit to be prescribed medication. Patient stated that her right lung already has scar tissue. Patient stated that she started with symptoms early Sunday morning. Patient signed on to [facility] for a virtual visit while on the phone with me. Patient was given ER precautions and she verbalized understanding.

## 2020-11-17 NOTE — Telephone Encounter (Signed)
I saw the VV note, with antiviral rx.  Thanks.

## 2020-12-24 ENCOUNTER — Other Ambulatory Visit: Payer: Self-pay | Admitting: Family Medicine

## 2020-12-24 DIAGNOSIS — E8881 Metabolic syndrome: Secondary | ICD-10-CM

## 2021-04-06 ENCOUNTER — Encounter: Payer: Self-pay | Admitting: Physician Assistant

## 2021-04-06 ENCOUNTER — Telehealth: Payer: BC Managed Care – PPO | Admitting: Physician Assistant

## 2021-04-06 DIAGNOSIS — B029 Zoster without complications: Secondary | ICD-10-CM | POA: Diagnosis not present

## 2021-04-06 MED ORDER — VALACYCLOVIR HCL 1 G PO TABS: 1000.0000 mg | ORAL_TABLET | Freq: Three times a day (TID) | ORAL | 0 refills | Status: DC

## 2021-04-06 NOTE — Progress Notes (Signed)
Virtual Visit Consent   Jodi Ballard, you are scheduled for a virtual visit with a [facility] provider today.     Just as with appointments in the office, your consent must be obtained to participate.  Your consent will be active for this visit and any virtual visit you may have with one of our providers in the next 365 days.     If you have a MyChart account, a copy of this consent can be sent to you electronically.  All virtual visits are billed to your insurance company just like a traditional visit in the office.    As this is a virtual visit, video technology does not allow for your provider to perform a traditional examination.  This may limit your provider's ability to fully assess your condition.  If your provider identifies any concerns that need to be evaluated in person or the need to arrange testing (such as labs, EKG, etc.), we will make arrangements to do so.     Although advances in technology are sophisticated, we cannot ensure that it will always work on either your end or our end.  If the connection with a video visit is poor, the visit may have to be switched to a telephone visit.  With either a video or telephone visit, we are not always able to ensure that we have a secure connection.     I need to obtain your verbal consent now.   Are you willing to proceed with your visit today?    Jodi Ballard has provided verbal consent on 04/06/2021 for a virtual visit (video or telephone).   Leeanne Rio, [place]   Date: 04/06/2021 7:00 PM   Virtual Visit via Video Note   I, Leeanne Rio, connected with  Jodi Ballard  (AT:[id number], 1965/06/13) on 04/06/21 at  6:45 PM EST by a video-enabled telemedicine application and verified that I am speaking with the correct person using two identifiers.  Location: Patient: Virtual Visit Location Patient: Home Provider: Virtual Visit Location Provider: Home Office   I discussed the limitations of evaluation and  management by telemedicine and the availability of in person appointments. The patient expressed understanding and agreed to proceed.    History of Present Illness: Jodi Ballard is a 56 y.o. who identifies as a female who was assigned female at birth, and is being seen today for possible shingles rash. Notes symptoms starting a few days ago with initial redness and irritation of periumbilical area  with subsequent development of a cluster of blistering lesions in the area. Notes pain is about 2-3/10. Now wearing loose-fitting clothing.   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  Now she can eat eggs w/o troubles except for photosensitivity after eating  eggs   Medications:  Current Outpatient Medications:    valACYclovir (VALTREX) 1000 MG tablet, Take 1 tablet (1,000 mg total) by mouth 3 (three) times daily for 7 days., Disp: 21 tablet, Rfl: 0   B Complex Vitamins (VITAMIN B COMPLEX PO), Take 1 capsule by mouth as directed. Powder with water., Disp: , Rfl:    calcium carbonate (OS-CAL) 600 MG TABS tablet, Take 600 mg by mouth 2 (two) times daily with a meal. Powder with water, Disp: , Rfl:    Cholecalciferol (VITAMIN D) 125 MCG (5000 UT) CAPS, Take 5,000 Units by mouth daily., Disp: 30 capsule, Rfl: 0    magnesium oxide (MAG-OX) 400 MG tablet, Take 400 mg by mouth daily., Disp: , Rfl:    metFORMIN (GLUCOPHAGE) 500 MG tablet, TAKE 1 TABLET BY MOUTH EVERY DAY WITH BREAKFAST, Disp: 30 tablet, Rfl: 2   metoprolol succinate (TOPROL-XL) 25 MG 24 hr tablet, TAKE 1/2 TABLET BY MOUTH EVERY DAY, Disp: 45 tablet, Rfl: 1   Multiple Vitamins-Minerals (ANTIOXIDANT) CAPS, Take by mouth., Disp: , Rfl:    Nutritional Supplements (ANTI-OXIDANT COMPLEX PO), Take 1 capsule by mouth daily. OPC3, Disp: , Rfl:    Nutritional Supplements (NUTRITIONAL SUPPLEMENT PO), Feminene 1 tablet by mouth daily, Disp: , Rfl:   Observations/Objective: Patient is well-developed, well-nourished in no acute distress.  Resting comfortably at home.  Head is normocephalic, atraumatic.  No labored breathing. Speech is clear and coherent with logical content.  Patient is alert and oriented at baseline.  Patient with areas of erythema adjacent to R-side of umbilicus with multiple blistering lesions noted, a few of which have coalesced into larger blister (this is at site of where her belt was previously rubbing).   Assessment and Plan: 1. Herpes zoster without complication - valACYclovir (VALTREX) 1000 MG tablet; Take 1 tablet (1,000 mg total) by mouth 3 (three) times daily for 7 days.  Dispense: 21 tablet; Refill: 0  Location and presentation concerning for shingles. Supportive measures and OTC medications reviewed. Rx Valtrex 1 g TID x 7 days. Follow-up with PCP as scheduled.   Follow Up Instructions: I discussed the assessment and treatment plan with the patient. The patient was provided an opportunity to ask questions and all were answered. The patient agreed with the plan and demonstrated an understanding of the instructions.  A copy of instructions were sent to the patient via MyChart unless otherwise noted below.   The patient was advised to call back or seek an in-person evaluation if the symptoms worsen or if the condition fails  to improve as anticipated.  Time:  I spent 10 minutes with the patient via telehealth technology discussing the above problems/concerns.    Leeanne Rio, PA-C

## 2021-04-06 NOTE — Patient Instructions (Addendum)
Delano Metz, thank you for joining Piedad Climes, PA-C for today's virtual visit.  While this provider is not your primary care provider (PCP), if your PCP is located in our provider database this encounter information will be shared with them immediately following your visit.  Consent: (Patient) Jodi Ballard provided verbal consent for this virtual visit at the beginning of the encounter.  Current Medications:  Current Outpatient Medications:    B Complex Vitamins (VITAMIN B COMPLEX PO), Take 1 capsule by mouth as directed. Powder with water., Disp: , Rfl:    benzonatate (TESSALON) 100 MG capsule, Take 1 capsule (100 mg total) by mouth 3 (three) times daily as needed for cough., Disp: 30 capsule, Rfl: 0   calcium carbonate (OS-CAL) 600 MG TABS tablet, Take 600 mg by mouth 2 (two) times daily with a meal. Powder with water, Disp: , Rfl:    Cholecalciferol (VITAMIN D) 125 MCG (5000 UT) CAPS, Take 5,000 Units by mouth daily., Disp: 30 capsule, Rfl: 0   magnesium oxide (MAG-OX) 400 MG tablet, Take 400 mg by mouth daily., Disp: , Rfl:    metFORMIN (GLUCOPHAGE) 500 MG tablet, TAKE 1 TABLET BY MOUTH EVERY DAY WITH BREAKFAST, Disp: 30 tablet, Rfl: 2   metoprolol succinate (TOPROL-XL) 25 MG 24 hr tablet, TAKE 1/2 TABLET BY MOUTH EVERY DAY, Disp: 45 tablet, Rfl: 1   Multiple Vitamins-Minerals (ANTIOXIDANT) CAPS, Take by mouth., Disp: , Rfl:    Nutritional Supplements (ANTI-OXIDANT COMPLEX PO), Take 1 capsule by mouth daily. OPC3, Disp: , Rfl:    Nutritional Supplements (NUTRITIONAL SUPPLEMENT PO), Feminene 1 tablet by mouth daily, Disp: , Rfl:    Medications ordered in this encounter:  No orders of the defined types were placed in this encounter.    *If you need refills on other medications prior to your next appointment, please contact your pharmacy*  Follow-Up: Call back or seek an in-person evaluation if the symptoms worsen or if the condition fails to improve as  anticipated.  Other Instructions Please keep the area clean, dry and covered. Again you are contagious until the blisters have all scabbed over. Avoid contact with pregnant women, infants and the very elderly until this occurs. Keep hands washed. Take the antiviral medication as directed. You can apply topical OTC lidocaine cream to the areas if needed.   Again if you note any new or worsening symptoms, or you develop blisters on the other side of your abdomen, please let us or your PCP know ASAP.   Shingles Shingles is an infection. It gives you a painful skin rash and blisters that have fluid in them. Shingles is caused by the same germ (virus) that causes chickenpox. Shingles only happens in people who: Have had chickenpox. Have been given a shot (vaccine) to protect against chickenpox. Shingles is rare in this group. What are the causes? This condition is caused by varicella-zoster virus. This is the same germ that causes chickenpox. After a person is exposed to the germ, the germ stays in the body but is not active (dormant). Shingles develops if the germ becomes active again (is reactivated). This can happen many years after the first exposure to the germ. It is not known what causes this germ to become active again. What increases the risk? People who have had chickenpox or received the chickenpox shot are at risk for shingles. This infection is more common in people who: Are older than 57 years of age. Have a weakened disease-fighting system (immune system),  such as people with: HIV (human immunodeficiency virus). AIDS (acquired immunodeficiency syndrome). Cancer. Are taking medicines that weaken the immune system, such as organ transplant medicines. Have a lot of stress. What are the signs or symptoms? The first symptoms of shingles may be itching, tingling, or pain in an area on your skin. A rash will show on your skin a few days or weeks later. This is what usually  happens: The rash is likely to be on one side of your body. The rash usually has a shape like a belt or a band. Over time, the rash turns into fluid-filled blisters. The blisters will break open and change into scabs. The scabs usually dry up in about 2-3 weeks. You may also have: A fever. Chills. A headache. A feeling like you may vomit (nausea). How is this treated? The rash may last for several weeks. There is not a specific cure for this condition. Your doctor may prescribe medicines. Medicines may: Help with pain. Help you get better sooner. Help to prevent long-term problems. Help with itching (antihistamines). If the area involved is on your face, you may need to see a specialist. This may be an eye doctor or an ear, nose, and throat (ENT) doctor. Follow these instructions at home: Medicines Take over-the-counter and prescription medicines only as told by your doctor. Put on an anti-itch cream or numbing cream where you have a rash, blisters, or scabs. Do this as told by your doctor. Helping with itching and discomfort  Put cold, wet cloths (cold compresses) on the area of the rash or blisters as told by your doctor. Cool baths can help you feel better. Try adding baking soda or dry oatmeal to the water to lessen itching. Do not bathe in hot water. Use calamine lotion as told by your doctor. Blister and rash care Keep your rash covered with a loose bandage (dressing). Wear loose clothing that does not rub on your rash. Wash your hands with soap and water for at least 20 seconds before and after you change your bandage. If you cannot use soap and water, use hand sanitizer. Change your bandage as told by your doctor. Keep your rash and blisters clean. To do this, wash the area with mild soap and cool water as told by your doctor. Check your rash every day for signs of infection. Check for: More redness, swelling, or pain. Fluid or blood. Warmth. Pus or a bad smell. Do not  scratch your rash. Do not pick at your blisters. To help you to not scratch: Keep your fingernails clean and cut short. Wear gloves or mittens when you sleep, if scratching is a problem. General instructions Rest as told by your doctor. Wash your hands often with soap and water for at least 20 seconds. If you cannot use soap and water, use hand sanitizer. Doing this lowers your chance of getting a skin infection. Your infection can cause chickenpox in people who have never had chickenpox or never got a chickenpox vaccine shot. If you have blisters that did not change into scabs yet, try not to touch other people or be around other people, especially: Babies. Pregnant women. Children who have areas of red, itchy, or rough skin (eczema). Older people who have organ transplants. People who have a long-term (chronic) illness, like cancer or AIDS. Keep all follow-up visits. How is this prevented? A vaccine shot is the best way to prevent shingles and protect against shingles problems. If you have not had a vaccine  shot, talk with your doctor about getting it. Where to find more information Centers for Disease Control and Prevention: [employer]Ex[employer].com.brwww.cdc.gov Contact a doctor if: Your pain does not get better with medicine. Your pain does not get better after the rash heals. You have any of these signs of infection around the rash: More redness, swelling, or pain. Fluid or blood. Warmth. Pus or a bad smell. You have a fever. Get help right away if: The rash is on your face or nose. You have pain in your face or pain by your eye. You lose feeling on one side of your face. You have trouble seeing. You have ear pain, or you have ringing in your ear. You have a loss of taste. Your condition gets worse. Summary Shingles gives you a painful skin rash and blisters that have fluid in them. Shingles is caused by the same germ (virus) that causes chickenpox. Keep your rash covered with a loose bandage. Wear  loose clothing that does not rub on your rash. If you have blisters that did not change into scabs yet, try not to touch other people or be around people. This information is not intended to replace advice given to you by your health care provider. Make sure you discuss any questions you have with your health care provider. Document Revised: 03/15/2020 Document Reviewed: 03/15/2020 Elsevier Patient Education  2022 [employer]    If you have been instructed to have an in-person evaluation today at a local Urgent Care facility, please use the link below. It will take you to a list of all of our available [facility], including address, phone number and hours of operation. Please do not delay care.  [facility]  If you or a family member do not have a primary care provider, use the link below to schedule a visit and establish care. When you choose a [facility] primary care physician or advanced practice provider, you gain a long-term partner in health. Find a Primary Care Provider  Learn more about [facility] in-office and virtual care options: [facility] - Get Care Now

## 2021-04-12 ENCOUNTER — Ambulatory Visit (INDEPENDENT_AMBULATORY_CARE_PROVIDER_SITE_OTHER): Payer: BC Managed Care – PPO | Admitting: Family Medicine

## 2021-04-12 ENCOUNTER — Other Ambulatory Visit: Payer: Self-pay

## 2021-04-12 ENCOUNTER — Encounter: Payer: Self-pay | Admitting: *Deleted

## 2021-04-12 ENCOUNTER — Encounter: Payer: Self-pay | Admitting: Family Medicine

## 2021-04-12 VITALS — BP 130/74 | HR 99 | Temp 98.2°F | Ht 62.0 in | Wt 262.0 lb

## 2021-04-12 DIAGNOSIS — Z7189 Other specified counseling: Secondary | ICD-10-CM

## 2021-04-12 DIAGNOSIS — E559 Vitamin D deficiency, unspecified: Secondary | ICD-10-CM

## 2021-04-12 DIAGNOSIS — E785 Hyperlipidemia, unspecified: Secondary | ICD-10-CM

## 2021-04-12 DIAGNOSIS — Z1211 Encounter for screening for malignant neoplasm of colon: Secondary | ICD-10-CM

## 2021-04-12 DIAGNOSIS — Z87898 Personal history of other specified conditions: Secondary | ICD-10-CM | POA: Diagnosis not present

## 2021-04-12 DIAGNOSIS — Z0001 Encounter for general adult medical examination with abnormal findings: Secondary | ICD-10-CM

## 2021-04-12 DIAGNOSIS — I10 Essential (primary) hypertension: Secondary | ICD-10-CM

## 2021-04-12 DIAGNOSIS — B029 Zoster without complications: Secondary | ICD-10-CM

## 2021-04-12 LAB — CBC WITH DIFFERENTIAL/PLATELET
Basophils Absolute: 0.1 10*3/uL (ref 0.0–0.1)
Basophils Relative: 1 % (ref 0.0–3.0)
Eosinophils Absolute: 0.3 10*3/uL (ref 0.0–0.7)
Eosinophils Relative: 3.9 % (ref 0.0–5.0)
HCT: 44.1 % (ref 36.0–46.0)
Hemoglobin: 14.3 g/dL (ref 12.0–15.0)
Lymphocytes Relative: 23.4 % (ref 12.0–46.0)
Lymphs Abs: 1.7 10*3/uL (ref 0.7–4.0)
MCHC: 32.6 g/dL (ref 30.0–36.0)
MCV: 87.2 fl (ref 78.0–100.0)
Monocytes Absolute: 0.5 10*3/uL (ref 0.1–1.0)
Monocytes Relative: 7.7 % (ref 3.0–12.0)
Neutro Abs: 4.5 10*3/uL (ref 1.4–7.7)
Neutrophils Relative %: 64 % (ref 43.0–77.0)
Platelets: 250 10*3/uL (ref 150.0–400.0)
RBC: 5.05 Mil/uL (ref 3.87–5.11)
RDW: 13.8 % (ref 11.5–15.5)
WBC: 7.1 10*3/uL (ref 4.0–10.5)

## 2021-04-12 LAB — LIPID PANEL
Cholesterol: 234 mg/dL — ABNORMAL HIGH (ref 0–200)
HDL: 50.9 mg/dL (ref >39.00)
LDL Cholesterol: 146 mg/dL — ABNORMAL HIGH (ref 0–99)
NonHDL: 182.74
Total CHOL/HDL Ratio: 5
Triglycerides: 184 mg/dL — ABNORMAL HIGH (ref 0.0–149.0)
VLDL: 36.8 mg/dL (ref 0.0–40.0)

## 2021-04-12 LAB — COMPREHENSIVE METABOLIC PANEL
ALT: 25 U/L (ref 0–35)
AST: 19 U/L (ref 0–37)
Albumin: 4.3 g/dL (ref 3.5–5.2)
Alkaline Phosphatase: 96 U/L (ref 39–117)
BUN: 15 mg/dL (ref 6–23)
CO2: 28 mEq/L (ref 19–32)
Calcium: 9.6 mg/dL (ref 8.4–10.5)
Chloride: 103 mEq/L (ref 96–112)
Creatinine, Ser: 0.76 mg/dL (ref 0.40–1.20)
GFR: 87.45 mL/min (ref >60.00)
Glucose, Bld: 89 mg/dL (ref 70–99)
Potassium: 5.1 mEq/L (ref 3.5–5.1)
Sodium: 138 mEq/L (ref 135–145)
Total Bilirubin: 0.4 mg/dL (ref 0.2–1.2)
Total Protein: 7 g/dL (ref 6.0–8.3)

## 2021-04-12 LAB — TSH: TSH: 1.33 u[IU]/mL (ref 0.35–5.50)

## 2021-04-12 LAB — VITAMIN D 25 HYDROXY (VIT D DEFICIENCY, FRACTURES): VITD: 49.36 ng/mL (ref 30.00–100.00)

## 2021-04-12 MED ORDER — NA SULFATE-K SULFATE-MG SULF 17.5-3.13-1.6 GM/177ML PO SOLN: 1.0000 | Freq: Once | ORAL | 0 refills | Status: AC

## 2021-04-12 NOTE — Progress Notes (Signed)
This visit occurred during the SARS-CoV-2 public health emergency.  Safety protocols were in place, including screening questions prior to the visit, additional usage of staff PPE, and extensive cleaning of exam room while observing appropriate contact time as indicated for disinfecting solutions.  CPE- See plan.  Routine anticipatory guidance given to patient.  See health maintenance.  The possibility exists that previously documented standard health maintenance information may have been brought forward from a previous encounter into this note.  If needed, that same information has been updated to reflect the current situation based on today's encounter.    Tetanus- not indicated.   Flu - d/w pt.  See allergy list.   PNA- not due.   Shingles- d/w pt.  Not indicated today.   Covid- encouraged.   Mammogram- 2022 pending for 2023 DXA not due.  Pap- per gynecology.   D/w patient [id number] for colon cancer screening, including IFOB vs. colonoscopy.  Risks and benefits of both were discussed and patient voiced understanding.  Pt elects for: colonoscopy and for eval re: hemorrhoids.   Advance directive d/w pt.  Bother Almon Hercules designated if patient were incapacitated.   HCV and HIV screening prev done at [facility] in the last 10 years per patient report.   She is getting divorced- has been separated for years.  Safe at home.    Off metformin, had been on it prev through weight loss clinic.  Recheck labs pending re: glucose and lipids re: HLD.    No seizures in decades.    She was dx'd with shingles recently.  Taking valtrex.  She had HA with taking valtrex.  D/w pt about stopping valtrex since lesions are crusted.  D/w pt about vaccine.  She isn't having sig pain.  Didn't cross midline.    She has h/o intentional weight loss with recent regain, d/w pt.    Hypertension/elevated pulse.   She has been off metoprolol.  Stress level is better with changing jobs and weight loss.  Pulse has been  ~90 at home.   Chest pain with exertion: no Edema:no- sig better with weight loss prev.   Short of breath:no Labs pending.   PMH and SH reviewed  Meds, vitals, and allergies reviewed.   ROS: Per HPI.  Unless specifically indicated otherwise in HPI, the patient denies:  General: fever. Eyes: acute vision changes ENT: sore throat Cardiovascular: chest pain Respiratory: SOB GI: vomiting GU: dysuria Musculoskeletal: acute back pain Derm: acute rash Neuro: acute motor dysfunction Psych: worsening mood Endocrine: polydipsia Heme: bleeding Allergy: hayfever  GEN: nad, alert and oriented HEENT: ncat NECK: supple w/o LA CV: rrr. PULM: ctab, no inc wob ABD: soft, +bs EXT: no edema SKIN: no acute rash but resolving 2 lesions on L abd wall, dermatomal, normal sensation.

## 2021-04-12 NOTE — Patient Instructions (Addendum)
Stop valtrex and let me know if the headaches don't get better.  Take care.  Glad to see you.  Ask the front to get a record release from Dr. Jorene Minors clinic about your last note, mammogram and pap.   Go to the lab on the way out.   If you have mychart we'll likely use that to update you.     Restart metoprolol if BP >140/>90 or if pulse >95 persistently.

## 2021-04-12 NOTE — Progress Notes (Signed)
Gastroenterology Pre-Procedure Review  Request Date: 05/10/2021 Requesting Physician: Dr. Allegra Lai  PATIENT REVIEW QUESTIONS: The patient responded to the following health history questions as indicated:    1. Are you having any GI issues?  Does have hemorrhoids. Does not need to see provider 1st. 2. Do you have a personal history of Polyps?  10/2012 No polyps removed. 3. Do you have a family history of Colon Cancer or Polyps? yes (Mother0 polyps) 4. Diabetes Mellitus? no 5. Joint replacements in the past 12 months?no 6. Major health problems in the past 3 months?no 7. Any artificial heart valves, MVP, or defibrillator?no    MEDICATIONS & ALLERGIES:    Patient reports the following regarding taking any anticoagulation/antiplatelet therapy:   Plavix, Coumadin, Eliquis, Xarelto, Lovenox, Pradaxa, Brilinta, or Effient? no Aspirin? no  Patient confirms/reports the following medications:  Current Outpatient Medications  Medication Sig Dispense Refill   B Complex Vitamins (VITAMIN B COMPLEX PO) Take 1 capsule by mouth as directed. Powder with water.     calcium carbonate (OS-CAL) 600 MG TABS tablet Take 600 mg by mouth 2 (two) times daily with a meal. Powder with water     Cholecalciferol (VITAMIN D) 125 MCG (5000 UT) CAPS Take 5,000 Units by mouth daily. 30 capsule 0   magnesium oxide (MAG-OX) 400 MG tablet Take 400 mg by mouth daily.     metoprolol succinate (TOPROL-XL) 25 MG 24 hr tablet TAKE 1/2 TABLET BY MOUTH EVERY DAY (Patient not taking: Reported on 04/12/2021) 45 tablet 1   Multiple Vitamins-Minerals (ANTIOXIDANT) CAPS Take by mouth.     Nutritional Supplements (ANTI-OXIDANT COMPLEX PO) Take 1 capsule by mouth daily. OPC3     Nutritional Supplements (NUTRITIONAL SUPPLEMENT PO) Feminene 1 tablet by mouth daily     No current facility-administered medications for this visit.    Patient confirms/reports the following allergies:  Allergies  Allergen Reactions   Tetanus Toxoid      REACTION: anaphylactic shock   Codeine     REACTION: n/ v   Eggs Or Egg-Derived Products     History egg allergy in childhood.  Now she can eat eggs w/o troubles except for photosensitivity after eating eggs   Valtrex [Valacyclovir]     Headache with use, presumed cause of headache.      No orders of the defined types were placed in this encounter.   AUTHORIZATION INFORMATION Primary Insurance: 1D#: Group #:  Secondary Insurance: 1D#: Group #:  SCHEDULE INFORMATION: Date: 05/10/2021 Time: Location: [facility]

## 2021-04-13 DIAGNOSIS — E785 Hyperlipidemia, unspecified: Secondary | ICD-10-CM | POA: Insufficient documentation

## 2021-04-13 DIAGNOSIS — Z7189 Other specified counseling: Secondary | ICD-10-CM | POA: Insufficient documentation

## 2021-04-13 DIAGNOSIS — Z0001 Encounter for general adult medical examination with abnormal findings: Secondary | ICD-10-CM | POA: Insufficient documentation

## 2021-04-13 DIAGNOSIS — B029 Zoster without complications: Secondary | ICD-10-CM | POA: Insufficient documentation

## 2021-04-13 NOTE — Assessment & Plan Note (Signed)
Tetanus- not indicated.   Flu - d/w pt.  See allergy list.   PNA- not due.   Shingles- d/w pt.  See below. Not indicated today.   Covid- encouraged.   Mammogram- 2022 pending for 2023 DXA not due.  Pap- per gynecology.   D/w patient [id number] for colon cancer screening, including IFOB vs. colonoscopy.  Risks and benefits of both were discussed and patient voiced understanding.  Pt elects for: colonoscopy and for eval re: hemorrhoids.   Advance directive d/w pt.  Bother Minda Ditto designated if patient were incapacitated.   HCV and HIV screening prev done at [facility] in the last 10 years per patient report.

## 2021-04-13 NOTE — Assessment & Plan Note (Signed)
°  She was dx'd with shingles recently.  Taking valtrex.  She had HA with taking valtrex.  D/w pt about stopping valtrex since lesions are crusted. Mild case, d/w pt about vaccine.

## 2021-04-13 NOTE — Assessment & Plan Note (Signed)
Advance directive d/w pt.  Bother Almon Hercules designated if patient were incapacitated.

## 2021-04-13 NOTE — Assessment & Plan Note (Signed)
Off metformin, had been on it prev through weight loss clinic.  Recheck labs pending re: glucose and lipids re: HLD.

## 2021-04-13 NOTE — Assessment & Plan Note (Signed)
She has been off metoprolol for about a month.  Advised that if her pulse is controlled and her blood pressure is also controlled she can stay off the medication.  See notes on labs.

## 2021-05-09 ENCOUNTER — Encounter: Payer: Self-pay | Admitting: Gastroenterology

## 2021-05-10 ENCOUNTER — Ambulatory Visit
Admission: RE | Admit: 2021-05-10 | Discharge: 2021-05-10 | Disposition: A | Discharge status: Home / Self Care | Payer: BC Managed Care – PPO | Attending: Gastroenterology | Admitting: Gastroenterology

## 2021-05-10 ENCOUNTER — Ambulatory Visit: Payer: BC Managed Care – PPO | Admitting: Certified Registered Nurse Anesthetist

## 2021-05-10 ENCOUNTER — Encounter: Payer: Self-pay | Admitting: Gastroenterology

## 2021-05-10 ENCOUNTER — Encounter
Admission: RE | Disposition: A | Discharge status: Home / Self Care | Payer: Self-pay | Source: Home / Self Care | Attending: Gastroenterology

## 2021-05-10 DIAGNOSIS — K644 Residual hemorrhoidal skin tags: Secondary | ICD-10-CM | POA: Insufficient documentation

## 2021-05-10 DIAGNOSIS — K219 Gastro-esophageal reflux disease without esophagitis: Secondary | ICD-10-CM | POA: Diagnosis not present

## 2021-05-10 DIAGNOSIS — Z8711 Personal history of peptic ulcer disease: Secondary | ICD-10-CM | POA: Diagnosis not present

## 2021-05-10 DIAGNOSIS — E785 Hyperlipidemia, unspecified: Secondary | ICD-10-CM | POA: Diagnosis not present

## 2021-05-10 DIAGNOSIS — R569 Unspecified convulsions: Secondary | ICD-10-CM | POA: Insufficient documentation

## 2021-05-10 DIAGNOSIS — Z87891 Personal history of nicotine dependence: Secondary | ICD-10-CM | POA: Insufficient documentation

## 2021-05-10 DIAGNOSIS — I10 Essential (primary) hypertension: Secondary | ICD-10-CM | POA: Diagnosis not present

## 2021-05-10 DIAGNOSIS — Z1211 Encounter for screening for malignant neoplasm of colon: Secondary | ICD-10-CM

## 2021-05-10 HISTORY — PX: COLONOSCOPY WITH PROPOFOL: SHX5780

## 2021-05-10 SURGERY — COLONOSCOPY WITH PROPOFOL
Anesthesia: General

## 2021-05-10 MED ORDER — LIDOCAINE HCL (PF) 2 % IJ SOLN
INTRAMUSCULAR | Status: AC
  Filled 2021-05-10: qty 5

## 2021-05-10 MED ORDER — STERILE WATER FOR IRRIGATION IR SOLN
Status: DC | PRN
  Administered 2021-05-10 (×2): 60 mL

## 2021-05-10 MED ORDER — PROPOFOL 10 MG/ML IV BOLUS
INTRAVENOUS | Status: DC | PRN
  Administered 2021-05-10: 70 mg via INTRAVENOUS
  Administered 2021-05-10: 20 mg via INTRAVENOUS

## 2021-05-10 MED ORDER — LIDOCAINE HCL (CARDIAC) PF 100 MG/5ML IV SOSY
PREFILLED_SYRINGE | INTRAVENOUS | Status: DC | PRN
  Administered 2021-05-10: 50 mg via INTRAVENOUS

## 2021-05-10 MED ORDER — PROPOFOL 500 MG/50ML IV EMUL
INTRAVENOUS | Status: DC | PRN
  Administered 2021-05-10: 150 ug/kg/min via INTRAVENOUS

## 2021-05-10 MED ORDER — SODIUM CHLORIDE 0.9 % IV SOLN: INTRAVENOUS | Status: DC

## 2021-05-10 MED ORDER — PROPOFOL 500 MG/50ML IV EMUL
INTRAVENOUS | Status: AC
  Filled 2021-05-10: qty 50
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
## 2021-05-10 NOTE — Transfer of Care (Signed)
Immediate Anesthesia Transfer of Care Note  Patient: Jodi Ballard  Procedure(s) Performed: COLONOSCOPY WITH PROPOFOL  Patient Location: Endoscopy Unit  Anesthesia Type:General  Level of Consciousness: drowsy  Airway & Oxygen Therapy: Patient Spontanous Breathing  Post-op Assessment: Report given to RN and Post -op Vital signs reviewed and stable  Post vital signs: Reviewed and stable  Last Vitals:  Vitals Value Taken Time  BP 84/67 05/10/21 0818  Temp 36.2 C 05/10/21 0817  Pulse 85 05/10/21 0818  Resp 12 05/10/21 0818  SpO2 97 % 05/10/21 0818  Vitals shown include unvalidated device data.  Last Pain:  Vitals:   05/10/21 0817  TempSrc: Temporal  PainSc: Asleep         Complications: No notable events documented.

## 2021-05-10 NOTE — Anesthesia Postprocedure Evaluation (Signed)
Anesthesia Post Note  Patient: Jodi Ballard  Procedure(s) Performed: COLONOSCOPY WITH PROPOFOL  Patient location during evaluation: Endoscopy Anesthesia Type: General Level of consciousness: awake and alert Pain management: pain level controlled Vital Signs Assessment: post-procedure vital signs reviewed and stable Respiratory status: spontaneous breathing, nonlabored ventilation, respiratory function stable and patient connected to nasal cannula oxygen Cardiovascular status: blood pressure returned to baseline and stable Postop Assessment: no apparent nausea or vomiting Anesthetic complications: no   No notable events documented.   Last Vitals:  Vitals:   05/10/21 0837 05/10/21 0847  BP: 129/84 (!) 143/91  Pulse: 77 76  Resp: 14 (!) 22  Temp:    SpO2: 95% 100%    Last Pain:  Vitals:   05/10/21 0847  TempSrc:   PainSc: 0-No pain                 Cleda Mccreedy Ceci Taliaferro
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
## 2021-05-10 NOTE — Op Note (Signed)
[facility] Gastroenterology Patient Name: Jodi Ballard Procedure Date: 05/10/2021 7:59 AM MRN: [id number] Account #: [id number] Date of Birth: 03/14/1966 Admit Type: Outpatient Age: 56 Room: [facility] ENDO ROOM 3 Gender: Female Note Status: Finalized Instrument Name: Colonscope [id number] Procedure:             Colonoscopy Indications:           Screening for colorectal malignant neoplasm Providers:             Toney Reil MD, MD Referring MD:          Dwana Curd. Para March, MD (Referring MD) Medicines:             General Anesthesia Complications:         No immediate complications. Estimated blood loss: None. Procedure:             Pre-Anesthesia Assessment:                        - Prior to the procedure, a History and Physical was                         performed, and patient medications and allergies were                         reviewed. The patient is competent. The risks and                         benefits of the procedure and the sedation options and                         risks were discussed with the patient. All questions                         were answered and informed consent was obtained.                         Patient identification and proposed procedure were                         verified by the physician, the nurse, the                         anesthesiologist, the anesthetist and the technician                         in the pre-procedure area in the procedure room in the                         endoscopy suite. Mental Status Examination: alert and                         oriented. Airway Examination: normal oropharyngeal                         airway and neck mobility. Respiratory Examination:                         clear to auscultation. CV Examination: normal.  Prophylactic Antibiotics: The patient does not require                         prophylactic antibiotics. Prior Anticoagulants: The                          patient has taken no previous anticoagulant or                         antiplatelet agents. ASA Grade Assessment: III - A                         patient with severe systemic disease. After reviewing                         the risks and benefits, the patient was deemed in                         satisfactory condition to undergo the procedure. The                         anesthesia plan was to use general anesthesia.                         Immediately prior to administration of medications,                         the patient was re-assessed for adequacy to receive                         sedatives. The heart rate, respiratory rate, oxygen                         saturations, blood pressure, adequacy of pulmonary                         ventilation, and response to care were monitored                         throughout the procedure. The physical status of the                         patient was re-assessed after the procedure.                        After obtaining informed consent, the colonoscope was                         passed under direct vision. Throughout the procedure,                         the patient's blood pressure, pulse, and oxygen                         saturations were monitored continuously. The                         Colonoscope was introduced through the anus and  advanced to the the cecum, identified by appendiceal                         orifice and ileocecal valve. The colonoscopy was                         performed without difficulty. The patient tolerated                         the procedure well. The quality of the bowel                         preparation was evaluated using the BBPS [facility] Bowel                         Preparation Scale) with scores of: Right Colon = 3,                         Transverse Colon = 3 and Left Colon = 3 (entire mucosa                         seen well with no residual staining, small fragments                          of stool or opaque liquid). The total BBPS score                         equals 9. Findings:      Skin tags were found on perianal exam.      The entire examined colon appeared normal.      The retroflexed view of the distal rectum and anal verge was normal and       showed no anal or rectal abnormalities. Impression:            - Perianal skin tags found on perianal exam.                        - The entire examined colon is normal.                        - The distal rectum and anal verge are normal on                         retroflexion view.                        - No specimens collected. Recommendation:        - Discharge patient to home (with escort).                        - Resume previous diet today.                        - Continue present medications.                        - Repeat colonoscopy in 10 years for screening  purposes. Procedure Code(s):     --- Professional ---                        A6301, Colorectal cancer screening; colonoscopy on                         individual not meeting criteria for high risk Diagnosis Code(s):     --- Professional ---                        Z12.11, Encounter for screening for malignant neoplasm                         of colon                        K64.4, Residual hemorrhoidal skin tags CPT copyright 2019 American Medical Association. All rights reserved. The codes documented in this report are preliminary and upon coder review may  be revised to meet current compliance requirements. Dr. Libby Maw Toney Reil MD, MD 05/10/2021 8:16:33 AM This report has been signed electronically. Number of Addenda: 0 Note Initiated On: 05/10/2021 7:59 AM Scope Withdrawal Time: 0 hours 7 minutes 46 seconds  Total Procedure Duration: 0 hours 10 minutes 33 seconds  Estimated Blood Loss:  Estimated blood loss: none.      [facility]

## 2021-05-11 ENCOUNTER — Encounter: Payer: Self-pay | Admitting: Gastroenterology

## 2021-05-19 DIAGNOSIS — Z124 Encounter for screening for malignant neoplasm of cervix: Secondary | ICD-10-CM | POA: Diagnosis not present

## 2021-05-19 DIAGNOSIS — R87610 Atypical squamous cells of undetermined significance on cytologic smear of cervix (ASC-US): Secondary | ICD-10-CM | POA: Diagnosis not present

## 2021-05-19 DIAGNOSIS — Z1231 Encounter for screening mammogram for malignant neoplasm of breast: Secondary | ICD-10-CM | POA: Diagnosis not present

## 2021-05-19 DIAGNOSIS — Z01419 Encounter for gynecological examination (general) (routine) without abnormal findings: Secondary | ICD-10-CM | POA: Diagnosis not present

## 2021-05-19 DIAGNOSIS — Z6841 Body Mass Index (BMI) 40.0 and over, adult: Secondary | ICD-10-CM | POA: Diagnosis not present

## 2021-06-09 ENCOUNTER — Encounter: Payer: Self-pay | Admitting: Family Medicine

## 2021-06-09 ENCOUNTER — Ambulatory Visit (INDEPENDENT_AMBULATORY_CARE_PROVIDER_SITE_OTHER): Payer: BC Managed Care – PPO | Admitting: Family Medicine

## 2021-06-09 ENCOUNTER — Other Ambulatory Visit: Payer: Self-pay

## 2021-06-09 VITALS — BP 130/78 | HR 83 | Temp 98.1°F | Ht 62.0 in | Wt 268.0 lb

## 2021-06-09 DIAGNOSIS — M25562 Pain in left knee: Secondary | ICD-10-CM | POA: Diagnosis not present

## 2021-06-09 DIAGNOSIS — S86112S Strain of other muscle(s) and tendon(s) of posterior muscle group at lower leg level, left leg, sequela: Secondary | ICD-10-CM | POA: Diagnosis not present

## 2021-06-09 DIAGNOSIS — M79662 Pain in left lower leg: Secondary | ICD-10-CM | POA: Diagnosis not present

## 2021-06-09 NOTE — Progress Notes (Signed)
? ? ?Aravind Chrismer T. Taris Galindo, MD, CAQ Sports Medicine ?Nature conservation officer at [facility] ?[address] [place] ?[place], [address] ? ?Phone: [phone]  FAX: [phone] ? ?Jodi Ballard - 56 y.o. female  MRN [id number]  Date of Birth: 08-Jan-1966 ? ?Date: 06/09/2021  PCP: Joaquim Nam, MD  Referral: Joaquim Nam, MD ? ?Chief Complaint  ?Patient presents with  ?? Knee Pain  ?  Left Knee Pain and Left Leg Pain. Left knee has been an issue in the past.  ? ? ?This visit occurred during the SARS-CoV-2 public health emergency.  Safety protocols were in place, including screening questions prior to the visit, additional usage of staff PPE, and extensive cleaning of exam room while observing appropriate contact time as indicated for disinfecting solutions.  ? ?Subjective:  ? ?Jodi Ballard is a 56 y.o. very pleasant female patient with Body mass index is 49.02 kg/m?. who presents with the following: ? ?1 year ago she did have some knee pain, and then her knee did get better.   ? ?Now has been dealing with her knee for about 2 months.  3-4 months.   ?OPC-3, multiple things.  ? ?Slipped on some water and had some twisting of her knee.  ?When gets up at night, and it will hurt to move around. ?Will ache at night.  Will wake up and at night.  ?No locking up or functional giving way. ? ?No prior major trauma, operative interventions.   ?She did have a prior R sided knee arthroscopy distantly. ? ? ?Review of Systems is noted in the HPI, as appropriate ? ?Objective:  ? ?BP 130/78 (BP Location: Left Arm, Patient Position: Sitting, Cuff Size: Large)   Pulse 83   Temp 98.1 ?F (36.7 ?C)   Ht 5\' 2"  (1.575 m)   Wt 268 lb (121.6 kg)   LMP 02/01/2018 (Approximate)   SpO2 98%   BMI 49.02 kg/m?  ? ?GEN: No acute distress; alert,appropriate. ?PULM: Breathing comfortably in no respiratory distress ?PSYCH: Normally interactive.  ? ? ?Knee:  L ?Gait: Normal heel toe pattern ?ROM: 0-115 ?Effusion: minimal   ?Echymosis or edema: none ?Patellar tendon NT ?Painful PLICA: neg ?Patellar grind: negative ?Medial and lateral patellar facet loading: negative ?medial and lateral joint lines:NT ?Mcmurray's neg ?Flexion-pinch neg ?Varus and valgus stress: stable ?Lachman: neg ?Ant and Post drawer: neg ?Hip abduction, IR, ER: WNL ?Hip flexion str: 5/5 ?Hip abd: 5/5 ?Quad: 5/5 ?VMO atrophy:No ?Hamstring concentric and eccentric: 5/5  ?She does have some pain in the musculotendinous junction in the midpoint of the calf.  There is no enlargement, bruising, but she does have some tenderness with squeezing the area.  It is focally palpated. ? ?Laboratory and Imaging Data: ? ?Assessment and Plan:  ? ?  ICD-10-CM   ?1. Gastrocnemius tear, left, sequela  S86.112S Ambulatory referral to Physical Therapy  ?  ? ?This is really more of an acute muscular injury that is taken a long time to heal.  I do think she has developed some weakness and really would do better with some therapy to work on her strength and balance as well as gait. ? ?Body helix or other neoprene sleeve when active. ? ?Follow-up as needed ? ?No orders of the defined types were placed in this encounter. ? ?Medications Discontinued During This Encounter  ?Medication Reason  ?? metoprolol succinate (TOPROL-XL) 25 MG 24 hr tablet Discontinued by provider  ? ?Orders Placed This Encounter  ?Procedures  ??  Ambulatory referral to Physical Therapy  ? ? ?Follow-up: Return for BODY HELIX. ? ?Dragon [employer] One speech-to-text software was used for transcription in this dictation.  Possible transcriptional errors can occur using Animal nutritionist.  ? ?Signed, ? ?Jasime Westergren T. Chyanne Kohut, MD ? ? ?Outpatient Encounter Medications as of 06/09/2021  ?Medication Sig  ?? B Complex Vitamins (VITAMIN B COMPLEX PO) Take 1 capsule by mouth as directed. Powder with water.  ?? calcium carbonate (OS-CAL) 600 MG TABS tablet Take 600 mg by mouth 2 (two) times daily with a meal. Powder with water  ??  Cholecalciferol (VITAMIN D) 125 MCG (5000 UT) CAPS Take 5,000 Units by mouth daily.  ?? magnesium oxide (MAG-OX) 400 MG tablet Take 400 mg by mouth daily.  ?? Multiple Vitamins-Minerals (ANTIOXIDANT) CAPS Take by mouth.  ?? Nutritional Supplements (ANTI-OXIDANT COMPLEX PO) Take 1 capsule by mouth daily. OPC3  ?? Nutritional Supplements (NUTRITIONAL SUPPLEMENT PO) Feminene 1 tablet by mouth daily  ?? [DISCONTINUED] metoprolol succinate (TOPROL-XL) 25 MG 24 hr tablet TAKE 1/2 TABLET BY MOUTH EVERY DAY  ? ?No facility-administered encounter medications on file as of 06/09/2021.  ?  ?

## 2021-06-12 ENCOUNTER — Encounter: Payer: Self-pay | Admitting: Family Medicine

## 2021-06-13 ENCOUNTER — Encounter: Payer: Self-pay | Admitting: *Deleted

## 2021-09-28 ENCOUNTER — Encounter: Payer: Self-pay | Admitting: Family Medicine

## 2021-09-29 ENCOUNTER — Telehealth: Payer: Self-pay | Admitting: Family Medicine

## 2021-09-29 NOTE — Telephone Encounter (Signed)
Agree, thanks.  Will see tomorrow.

## 2021-09-29 NOTE — Telephone Encounter (Signed)
I spoke with pt;rt jaw swollen 1 1/2 - 2 weeks. Pt has not had injury and no tooth or gum problems. Pt has slight swelling today in rt jaw area with pain level now of 2. Pt said sometimes at night hurts more like pain level of 8 but pt takes ibuprofen and that lessens pain so pt can sleep. Pt said about 1 1/2 - 2 years ago pt had similar symptoms and saw a specialist and was given abx and used heat and sugar free sour candy. Pt said she cannot find the sugar free sour candy now. I spoke with  [facility] [address] and they do have sugar free sour candy on [address] . Pt was made aware. Pt said she did take amoxicillin 500 mg twice a day from 09/19/21 -09/22/21 and that has decreased the swelling but the area is not completely back to normal. pt has not run a fever. UC & ED precautions given and pt voiced understanding. Pt scheduled appt with Dr Para March on 09/30/21 at 12:30; pt concerned with holiday coming up. Sending note to Dr Para March and Shanda Bumps CMA.

## 2021-09-29 NOTE — Telephone Encounter (Signed)
Please triage patient about salivary gland sx at this point.  Thanks.

## 2021-09-30 ENCOUNTER — Ambulatory Visit: Payer: BC Managed Care – PPO | Admitting: Family Medicine

## 2021-09-30 DIAGNOSIS — R609 Edema, unspecified: Secondary | ICD-10-CM | POA: Diagnosis not present

## 2021-09-30 MED ORDER — AMOXICILLIN 500 MG PO CAPS: 500.0000 mg | ORAL_CAPSULE | Freq: Two times a day (BID) | ORAL | 1 refills | Status: AC
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
## 2021-09-30 NOTE — Patient Instructions (Signed)
Use lemons/limes/sour candy.    If worse, then restart amoxil and update [place].    We can refer if needed.   Take care.  Glad to see you.

## 2021-10-02 NOTE — Assessment & Plan Note (Signed)
Clearly better in the meantime.  Unlikely that she would have a permanent stricture or blockage.  No reason to suspect an obstructing tumor.  No reason to suspect an abscess.  No reason to suspect any of that given her clinical improvement.  Rationale discussed with patient.  Discussed options going forward.  Use lemons/limes/sour candy.    If worse, then restart amoxil and update [place].    We can refer to ENT if needed.  She agrees with plan.

## 2021-11-07 ENCOUNTER — Encounter: Payer: Self-pay | Admitting: Family Medicine

## 2021-11-07 ENCOUNTER — Telehealth (INDEPENDENT_AMBULATORY_CARE_PROVIDER_SITE_OTHER): Payer: BC Managed Care – PPO | Admitting: Family Medicine

## 2021-11-07 ENCOUNTER — Telehealth: Payer: Self-pay | Admitting: Family Medicine

## 2021-11-07 DIAGNOSIS — B029 Zoster without complications: Secondary | ICD-10-CM

## 2021-11-07 MED ORDER — VALACYCLOVIR HCL 1 G PO TABS: 1000.0000 mg | ORAL_TABLET | Freq: Three times a day (TID) | ORAL | 0 refills | Status: AC

## 2021-11-07 NOTE — Telephone Encounter (Signed)
Patient called in stating she woke up with the shingles. She wanted to talk to someone regarding a prescription. She stated last time she had the shingles that she was having headaches and wasn't sure if it was the medication.  Stated she woke up with a headache this morning and not sure if its a side effect from the shingles. Patient wants to know if she should get a prescription. Thank you!

## 2021-11-07 NOTE — Telephone Encounter (Signed)
See note from today

## 2021-11-07 NOTE — Progress Notes (Signed)
I connected with Jodi Rochester Marchesi on 11/07/21 at 10:40 AM EDT by video and verified that I am speaking with the correct person using two identifiers.   I discussed the limitations, risks, security and privacy concerns of performing an evaluation and management service by video and the availability of in person appointments. I also discussed with the patient that there may be a patient responsible charge related to this service. The patient expressed understanding and agreed to proceed.  Patient location: Home Provider Location:  [place] Participants: Lynnda Child and Jodi Ballard   Subjective:     Jodi Ballard is a 56 y.o. female presenting for Rash (L flank x 1 day. Pictures were sent to you via mychart. )     Rash    #Rash - 2 small spots on the lower stomach - just under the panty line - previous was right beside the belly button on the  - did have this previously and resolved with medication - noticed the rash today  - woke up this morning with pain - last time thought it was a bite - itchy burning pain with rash  Review of Systems  Skin:  Positive for rash.     Social History   Tobacco Use  Smoking Status Former   Types: Cigarettes   Quit date: 04/03/1993   Years since quitting: 28.6  Smokeless Tobacco Never        Objective:   BP Readings from Last 3 Encounters:  09/30/21 136/80  06/09/21 130/78  05/10/21 (!) 143/91   Wt Readings from Last 3 Encounters:  09/30/21 269 lb (122 kg)  06/09/21 268 lb (121.6 kg)  05/10/21 257 lb (116.6 kg)     Physical Exam Constitutional:      Appearance: Normal appearance. She is not ill-appearing.  HENT:     Head: Normocephalic and atraumatic.     Right Ear: External ear normal.     Left Ear: External ear normal.  Eyes:     Conjunctiva/sclera: Conjunctivae normal.  Pulmonary:     Effort: Pulmonary effort is normal. No respiratory distress.  Neurological:     Mental Status: She is  alert. Mental status is at baseline.  Psychiatric:        Mood and Affect: Mood normal.        Behavior: Behavior normal.        Thought Content: Thought content normal.        Judgment: Judgment normal.     Reviewed image - 2 raised lesions with an erythematous base      Assessment & Plan:   Problem List Items Addressed This Visit       Other   Shingles - Primary    Recurrent shingles episode approximately 7 months after the last.  Reviewed imaging from January 2023 as well as from today.  January these appeared more vesicular in nature or ulcerative, today they appear to be more consistent with papules, but imaging quality not great.  She notes she did improve on valacyclovir with last episode, will prescribe the same given similar presentation and similar body region.  Strongly emphasized getting the shingles vaccine.  Also strongly advised that if she gets another episode she should schedule an in person visit to evaluate directly.  And may need to consider additional work-up given recurrent shingles without patient being on the immunosuppressants.      Relevant Medications   valACYclovir (VALTREX) 1000 MG tablet  Return if symptoms worsen or fail to improve.  Lesleigh Noe, MD

## 2021-11-07 NOTE — Assessment & Plan Note (Signed)
Recurrent shingles episode approximately 7 months after the last.  Reviewed imaging from January 2023 as well as from today.  January these appeared more vesicular in nature or ulcerative, today they appear to be more consistent with papules, but imaging quality not great.  She notes she did improve on valacyclovir with last episode, will prescribe the same given similar presentation and similar body region.  Strongly emphasized getting the shingles vaccine.  Also strongly advised that if she gets another episode she should schedule an in person visit to evaluate directly.  And may need to consider additional work-up given recurrent shingles without patient being on the immunosuppressants.

## 2021-11-07 NOTE — Telephone Encounter (Signed)
Spoke to patient by telephone and was advised that she woke up this morning with shingles. Patent stated at this time she only has one spot but was hoping to get medication. Offered patient an appointment which she stated that she is at work and not able to come to the office. Patient scheduled a video visit today 11/07/21 with Dr. Selena Batten at 10:40 am.

## 2021-11-09 ENCOUNTER — Encounter (INDEPENDENT_AMBULATORY_CARE_PROVIDER_SITE_OTHER): Payer: Self-pay

## 2022-06-19 ENCOUNTER — Telehealth: Payer: Self-pay | Admitting: Family Medicine

## 2022-06-19 ENCOUNTER — Other Ambulatory Visit: Payer: Self-pay

## 2022-06-19 ENCOUNTER — Emergency Department: Payer: BC Managed Care – PPO

## 2022-06-19 ENCOUNTER — Emergency Department
Admission: EM | Admit: 2022-06-19 | Discharge: 2022-06-19 | Disposition: A | Discharge status: Home / Self Care | Payer: BC Managed Care – PPO | Attending: Emergency Medicine | Admitting: Emergency Medicine

## 2022-06-19 DIAGNOSIS — R1032 Left lower quadrant pain: Secondary | ICD-10-CM | POA: Insufficient documentation

## 2022-06-19 DIAGNOSIS — K625 Hemorrhage of anus and rectum: Secondary | ICD-10-CM | POA: Diagnosis not present

## 2022-06-19 DIAGNOSIS — I10 Essential (primary) hypertension: Secondary | ICD-10-CM | POA: Insufficient documentation

## 2022-06-19 DIAGNOSIS — R109 Unspecified abdominal pain: Secondary | ICD-10-CM | POA: Diagnosis not present

## 2022-06-19 LAB — CBC WITH DIFFERENTIAL/PLATELET
Abs Immature Granulocytes: 0.03 10*3/uL (ref 0.00–0.07)
Basophils Absolute: 0.1 10*3/uL (ref 0.0–0.1)
Basophils Relative: 1 %
Eosinophils Absolute: 0.2 10*3/uL (ref 0.0–0.5)
Eosinophils Relative: 3 %
HCT: 45.9 % (ref 36.0–46.0)
Hemoglobin: 14.6 g/dL (ref 12.0–15.0)
Immature Granulocytes: 0 %
Lymphocytes Relative: 24 %
Lymphs Abs: 1.6 10*3/uL (ref 0.7–4.0)
MCH: 28.2 pg (ref 26.0–34.0)
MCHC: 31.8 g/dL (ref 30.0–36.0)
MCV: 88.6 fL (ref 80.0–100.0)
Monocytes Absolute: 0.6 10*3/uL (ref 0.1–1.0)
Monocytes Relative: 9 %
Neutro Abs: 4.2 10*3/uL (ref 1.7–7.7)
Neutrophils Relative %: 63 %
Platelets: 255 10*3/uL (ref 150–400)
RBC: 5.18 MIL/uL — ABNORMAL HIGH (ref 3.87–5.11)
RDW: 13.4 % (ref 11.5–15.5)
WBC: 6.7 10*3/uL (ref 4.0–10.5)
nRBC: 0 % (ref 0.0–0.2)

## 2022-06-19 LAB — COMPREHENSIVE METABOLIC PANEL
ALT: 25 U/L (ref 0–44)
AST: 21 U/L (ref 15–41)
Albumin: 4.1 g/dL (ref 3.5–5.0)
Alkaline Phosphatase: 87 U/L (ref 38–126)
Anion gap: 9 (ref 5–15)
BUN: 14 mg/dL (ref 6–20)
CO2: 25 mmol/L (ref 22–32)
Calcium: 9.4 mg/dL (ref 8.9–10.3)
Chloride: 106 mmol/L (ref 98–111)
Creatinine, Ser: 0.81 mg/dL (ref 0.44–1.00)
GFR, Estimated: >60 mL/min (ref >60)
Glucose, Bld: 95 mg/dL (ref 70–99)
Potassium: 4.8 mmol/L (ref 3.5–5.1)
Sodium: 140 mmol/L (ref 135–145)
Total Bilirubin: 0.6 mg/dL (ref 0.3–1.2)
Total Protein: 7.6 g/dL (ref 6.5–8.1)

## 2022-06-19 LAB — URINALYSIS, ROUTINE W REFLEX MICROSCOPIC
Bilirubin Urine: NEGATIVE
Glucose, UA: NEGATIVE mg/dL
Hgb urine dipstick: NEGATIVE
Ketones, ur: NEGATIVE mg/dL
Nitrite: NEGATIVE
Protein, ur: NEGATIVE mg/dL
Specific Gravity, Urine: 1.013 (ref 1.005–1.030)
pH: 6 (ref 5.0–8.0)

## 2022-06-19 LAB — LIPASE, BLOOD: Lipase: 37 U/L (ref 11–51)

## 2022-06-19 MED ORDER — DOCUSATE SODIUM 100 MG PO CAPS
100.0000 mg | ORAL_CAPSULE | Freq: Two times a day (BID) | ORAL | 2 refills | Status: AC
Start: 2022-06-19 — End: 2023-06-19

## 2022-06-19 MED ORDER — AMOXICILLIN-POT CLAVULANATE 875-125 MG PO TABS
1.0000 | ORAL_TABLET | Freq: Once | ORAL | Status: AC
Start: 2022-06-19 — End: 2022-06-19
  Administered 2022-06-19: 1 via ORAL
  Filled 2022-06-19: qty 1

## 2022-06-19 MED ORDER — IOHEXOL 300 MG/ML  SOLN
100.0000 mL | Freq: Once | INTRAMUSCULAR | Status: AC | PRN
  Administered 2022-06-19: 100 mL via INTRAVENOUS

## 2022-06-19 MED ORDER — AMOXICILLIN-POT CLAVULANATE 875-125 MG PO TABS: 1.0000 | ORAL_TABLET | Freq: Two times a day (BID) | ORAL | 0 refills | Status: AC

## 2022-06-19 NOTE — Telephone Encounter (Signed)
Per chart review tab pt is at [facility] ED now. Sending note to Dr Damita Dunnings and Damita Dunnings pool.

## 2022-06-19 NOTE — Telephone Encounter (Signed)
[facility] - Client TELEPHONE ADVICE RECORD [facility] Patient Name: Jodi Ballard Gender: Female DOB: 03-Feb-1966 Age: 57 Y 62 M 5 D Return Phone Number: [phone] (Primary) Address: City/ State/ Zip: [place] [address] Client [place] [facility] - Client Client Site [facility] - Day Provider Elsie Stain "Brigitte Pulse MD Contact Type Call Who Is Calling Patient / Member / Family / Caregiver Call Type Triage / Clinical Relationship To Patient Self Return Phone Number [phone] (Primary) Chief Complaint Rectal Bleeding Reason for Call Symptomatic / Request for [place] states he been bleeding from her rectum for the past 4 days. Translation No Nurse Assessment Nurse: Patsey Berthold, RN, Roma Kayser Date/Time Eilene Ghazi Time): 06/19/2022 3:25:24 PM Confirm and document reason for call. If symptomatic, describe symptoms. ---Caller states he been bleeding from her rectum for the past 4 days. Has happened before, is fresh bright blood, usually does not last more than a day. She has some bloating, a dull pain to abdomen, has had increased blood than her normal, it will be dripping in toilet, enough to color water, has never lasted this long. Negative colonoscopy a few months ago. Does the patient have any new or worsening symptoms? ---Yes Will a triage be completed? ---Yes Related visit to physician within the last 2 weeks? ---No Does the PT have any chronic conditions? (i.e. diabetes, asthma, this includes High risk factors for pregnancy, etc.) ---No Is this a behavioral health or substance abuse call? ---No Guidelines Guideline Title Affirmed Question Affirmed Notes Nurse Date/Time Eilene Ghazi Time) Rectal Bleeding [1] Constant abdominal pain AND [2] present > 2 hours Vivi Ferns 06/19/2022 3:27:31 PM Disp. Time Eilene Ghazi Time) Disposition Final  User 06/19/2022 3:33:08 PM Go to ED Now Yes Patsey Berthold, RN, Milagros Loll NOTE: All timestamps contained within this report are represented as [place] Standard Time. CONFIDENTIALTY NOTICE: This fax transmission is intended only for the addressee. It contains information that is legally privileged, confidential or otherwise protected from use or disclosure. If you are not the intended recipient, you are strictly prohibited from reviewing, disclosing, copying using or disseminating any of this information or taking any action in reliance on or regarding this information. If you have received this fax in error, please notify us immediately by telephone so that we can arrange for its return to [place]. Phone: [phone], Toll-Free: [phone], Fax: [phone] Page: 2 of 2 Call Id: [id number] Final Disposition 06/19/2022 3:33:08 PM Go to ED Now Yes Patsey Berthold, RN, Melvyn Novas Disagree/Comply Comply Caller Understands Yes PreDisposition Call Doctor Care Advice Given Per Guideline GO TO ED NOW: * You need to be seen in the Emergency Department. CARE ADVICE given per Rectal Bleeding (Adult) guideline. Comments User: Diana Eves, RN Date/Time Eilene Ghazi Time): 06/19/2022 3:27:53 PM Has only taken tylenol for pain. User: Diana Eves, RN Date/Time Eilene Ghazi Time): 06/19/2022 3:33:07 PM Caller will call family and ask them to give her transportation/ consult with them. Referrals [place]

## 2022-06-19 NOTE — ED Triage Notes (Signed)
BRBPR since yesterday; She also reports bloating and generalized abdominal pain since Saturday

## 2022-06-19 NOTE — Telephone Encounter (Signed)
Patient called in and stated that she has had rectal bleeding for the past 4 days. Sent over to access nurse.

## 2022-06-19 NOTE — ED Provider Notes (Signed)
[facility] Provider Note    Event Date/Time   First MD Initiated Contact with Patient 06/19/22 1749     (approximate)  History   Chief Complaint: Rectal Bleeding (BRBPR since yesterday; She also reports bloating and generalized abdominal pain since Saturday)  HPI  Jodi Ballard is a 57 y.o. female with a past medical history of colitis previously, gastric reflux, hypertension, presents emergency department for abdominal bloating as well as bright red blood per rectum.  According to the patient over the past 2 days she has noted some bright red blood mostly when having a bowel movement.  States her bowel movement is mostly stool but has noticed blood in the toilet.  Today states she sat on the toilet to urinate and did have a small amount of blood dripped into the toilet.  Patient states mild abdominal discomfort mostly in the left lower quadrant.  History of colitis previously.  Patient states a recent colonoscopy several months ago that was normal per patient.  Physical Exam   Triage Vital Signs: ED Triage Vitals  Enc Vitals Group     BP 06/19/22 1620 (!) 165/83     Pulse Rate 06/19/22 1620 97     Resp 06/19/22 1620 19     Temp 06/19/22 1620 98.6 F (37 C)     Temp Source 06/19/22 1620 Oral     SpO2 06/19/22 1620 98 %     Weight 06/19/22 1623 271 lb 3.2 oz (123 kg)     Height 06/19/22 1623 5\' 2"  (1.575 m)     Head Circumference --      Peak Flow --      Pain Score 06/19/22 1622 4     Pain Loc --      Pain Edu? --      Excl. in [place]? --     Most recent vital signs: Vitals:   06/19/22 1620  BP: (!) 165/83  Pulse: 97  Resp: 19  Temp: 98.6 F (37 C)  SpO2: 98%    General: Awake, no distress.  CV:  Good peripheral perfusion.  Regular rate and rhythm  Resp:  Normal effort.  Equal breath sounds bilaterally.  Abd:  No distention.  Soft, mild left lower quadrant tenderness to palpation.  No rebound or guarding.   ED Results / Procedures /  Treatments   MEDICATIONS ORDERED IN ED: Medications - No data to display   IMPRESSION / MDM / [place] / ED COURSE  I reviewed the triage vital signs and the nursing notes.  Patient's presentation is most consistent with acute presentation with potential threat to life or bodily function.  Patient presents emergency department for rectal bleeding intermittent over the past 2 days along with abdominal bloating and left lower quadrant tenderness on exam.  Patient does have 2 small hemorrhoids on exam although not actively inflamed.  Patient could possibly be experiencing external or internal hemorrhoidal bleeding.  Given the mild left lower quadrant tenderness on exam this would raise concern for diverticulitis or colitis.  Patient's lab work is reassuring including a normal H&H, unchanged from baseline as well as a reassuring chemistry and a normal urinalysis.  Given left lower quadrant discomfort we will obtain a CT scan to further evaluate.  Patient is agreeable to plan of care.  CT scan has resulted showing no significant finding.  Discussed with the patient possibility of early colitis versus hemorrhoidal bleeding.  Will place the patient on Augmentin twice  daily as well as Colace stool softener.  Will have the patient follow-up with her GI doctor.  Patient is agreeable to plan.  FINAL CLINICAL IMPRESSION(S) / ED DIAGNOSES   Left lower quadrant pain Rectal bleeding   Note:  This document was prepared using Dragon voice recognition software and may include unintentional dictation errors.   Harvest Dark, MD 06/19/22 1907

## 2022-06-19 NOTE — Telephone Encounter (Signed)
Noted. Will await ER report.  Thanks.  °

## 2022-07-13 DIAGNOSIS — Z1231 Encounter for screening mammogram for malignant neoplasm of breast: Secondary | ICD-10-CM | POA: Diagnosis not present

## 2022-07-13 DIAGNOSIS — Z01419 Encounter for gynecological examination (general) (routine) without abnormal findings: Secondary | ICD-10-CM | POA: Diagnosis not present

## 2022-07-13 DIAGNOSIS — Z124 Encounter for screening for malignant neoplasm of cervix: Secondary | ICD-10-CM | POA: Diagnosis not present

## 2022-07-13 LAB — HM MAMMOGRAPHY

## 2022-07-25 DIAGNOSIS — M25562 Pain in left knee: Secondary | ICD-10-CM | POA: Diagnosis not present

## 2023-06-26 ENCOUNTER — Ambulatory Visit: Payer: Self-pay

## 2023-06-26 ENCOUNTER — Encounter: Payer: Self-pay | Admitting: Nurse Practitioner

## 2023-06-26 ENCOUNTER — Telehealth (INDEPENDENT_AMBULATORY_CARE_PROVIDER_SITE_OTHER): Admitting: Nurse Practitioner

## 2023-06-26 VITALS — Ht 62.0 in | Wt 258.0 lb

## 2023-06-26 DIAGNOSIS — J0140 Acute pansinusitis, unspecified: Secondary | ICD-10-CM | POA: Diagnosis not present

## 2023-06-26 DIAGNOSIS — U071 COVID-19: Secondary | ICD-10-CM | POA: Diagnosis not present

## 2023-06-26 MED ORDER — PREDNISONE 20 MG PO TABS: 40.0000 mg | ORAL_TABLET | Freq: Every day | ORAL | 0 refills | Status: AC

## 2023-06-26 MED ORDER — GUAIFENESIN ER 600 MG PO TB12
600.0000 mg | ORAL_TABLET | Freq: Two times a day (BID) | ORAL | 0 refills | Status: AC
Start: 2023-06-26 — End: ?

## 2023-06-26 MED ORDER — BENZONATATE 100 MG PO CAPS: 100.0000 mg | ORAL_CAPSULE | Freq: Three times a day (TID) | ORAL | 1 refills | Status: AC | PRN

## 2023-06-26 MED ORDER — AZITHROMYCIN 250 MG PO TABS: ORAL_TABLET | ORAL | 0 refills | Status: AC

## 2023-06-26 NOTE — Telephone Encounter (Signed)
 Noted. Thanks.

## 2023-06-26 NOTE — Progress Notes (Signed)
 Virtual Visit via Video Note  I connected with Jodi Ballard on 06/26/23 at 4:53 PM by a video enabled telemedicine application and verified that I am speaking with the correct person using two identifiers.   Patient Location: Home Provider Location: Office/Clinic  I discussed the limitations, risks, security, and privacy concerns of performing an evaluation and management service by video and the availability of in person appointments. I also discussed with the patient that there may be a patient responsible charge related to this service. The patient expressed understanding and agreed to proceed.  Subjective: PCP: Joaquim Nam, MD  Chief Complaint  Patient presents with   Acute Visit    Covid positive Would like prescription for symptom management    Patient is seen due to positive COVID home test today. Her symptoms started last night.   Her Aunt tested positive for COVID and went to her home to drop off some stuff.   The associated symptoms includes fever, chills, cough, congestion, sore throat, heavy chest and sob, body aches, teeth and facial pain.   Medication used: Tylenol sinus and allergy.  She had COVID before and no COVID vaccine.  She does not have oximeter to check her oxygen saturation.  HPI   ROS: Per HPI  Current Outpatient Medications:    amoxicillin-clavulanate (AUGMENTIN) 875-125 MG tablet, Take 1 tablet by mouth 2 (two) times daily., Disp: 20 tablet, Rfl: 0   azithromycin (ZITHROMAX) 250 MG tablet, Take 2 tablets on day 1, then 1 tablet daily on days 2 through 5, Disp: 6 tablet, Rfl: 0   B Complex Vitamins (VITAMIN B COMPLEX PO), Take 1 capsule by mouth as directed. Powder with water., Disp: , Rfl:    benzonatate (TESSALON PERLES) 100 MG capsule, Take 1 capsule (100 mg total) by mouth 3 (three) times daily as needed., Disp: 30 capsule, Rfl: 1   calcium carbonate (OS-CAL) 600 MG TABS tablet, Take 600 mg by mouth 2 (two) times daily with a meal.  Powder with water, Disp: , Rfl:    Cholecalciferol (VITAMIN D) 125 MCG (5000 UT) CAPS, Take 5,000 Units by mouth daily., Disp: 30 capsule, Rfl: 0   guaiFENesin (MUCINEX) 600 MG 12 hr tablet, Take 1 tablet (600 mg total) by mouth 2 (two) times daily., Disp: 30 tablet, Rfl: 0   magnesium oxide (MAG-OX) 400 MG tablet, Take 400 mg by mouth daily., Disp: , Rfl:    Multiple Vitamins-Minerals (ANTIOXIDANT) CAPS, Take by mouth., Disp: , Rfl:    Nutritional Supplements (ANTI-OXIDANT COMPLEX PO), Take 1 capsule by mouth daily. OPC3, Disp: , Rfl:    Nutritional Supplements (NUTRITIONAL SUPPLEMENT PO), Feminene 1 tablet by mouth daily, Disp: , Rfl:    predniSONE (DELTASONE) 20 MG tablet, Take 2 tablets (40 mg total) by mouth daily with breakfast for 5 days., Disp: 10 tablet, Rfl: 0   amoxicillin (AMOXIL) 500 MG capsule, Take 1 capsule (500 mg total) by mouth 2 (two) times daily. If parotid swelling. (Patient not taking: Reported on 06/26/2023), Disp: 14 capsule, Rfl: 1  Observations/Objective: Today's Vitals   06/26/23 1527  Weight: 258 lb (117 kg)  Height: 5\' 2"  (1.575 m)  PainSc: 7   PainLoc: Face   Physical Exam Constitutional:      General: She is not in acute distress.    Appearance: Normal appearance. She is not ill-appearing.  Eyes:     Conjunctiva/sclera: Conjunctivae normal.  Pulmonary:     Effort: No respiratory distress.  Neurological:  Mental Status: She is alert.  Psychiatric:        Mood and Affect: Mood normal.        Behavior: Behavior normal.        Thought Content: Thought content normal.        Judgment: Judgment normal.     Assessment and Plan: Positive self-administered antigen test for COVID-19 Assessment & Plan: Pt has sinus pressure, congestion, fever, sore throat, SOB due to congestion and body aches. -Symptomatic treatment discussed with pt. -Will start of prednisone 40 mg once a day for 5 days, azithromycin, tessalon perls and guaifenesin. Increase fluid  intake and take tylenol for fever and body aches.  -Advised to check oxygen saturation at home. -Increase fluid intake. -Red flag discussed.  Patient was provided clear instructions to go to ER or urgent care if symptoms does not improve, red flag or new problem develops.  Patient verbalized understanding.     Acute non-recurrent pansinusitis  Other orders -     predniSONE; Take 2 tablets (40 mg total) by mouth daily with breakfast for 5 days.  Dispense: 10 tablet; Refill: 0 -     Azithromycin; Take 2 tablets on day 1, then 1 tablet daily on days 2 through 5  Dispense: 6 tablet; Refill: 0 -     Benzonatate; Take 1 capsule (100 mg total) by mouth 3 (three) times daily as needed.  Dispense: 30 capsule; Refill: 1 -     guaiFENesin ER; Take 1 tablet (600 mg total) by mouth 2 (two) times daily.  Dispense: 30 tablet; Refill: 0    Follow Up Instructions: No follow-ups on file.   I discussed the assessment and treatment plan with the patient. The patient was provided an opportunity to ask questions, and all were answered. The patient agreed with the plan and demonstrated an understanding of the instructions.   The patient was advised to call back or seek an in-person evaluation if the symptoms worsen or if the condition fails to improve as anticipated.  The above assessment and management plan was discussed with the patient. The patient verbalized understanding of and has agreed to the management plan.   Kara Dies, NP

## 2023-06-26 NOTE — Telephone Encounter (Signed)
 Chief Complaint: Covid positive Symptoms: Mild cough, body aches, sinus pressure/pain in face, chills Frequency: since early Monday morning Pertinent Negatives: Patient denies n/a Disposition: [] ED /[] Urgent Care (no appt availability in office) / [x] Appointment(In office/virtual)/ []  [facility]/ [] Home Care/ [] Refused Recommended Disposition /[] [facility]/ []  Follow-up with PCP Additional Notes: Patient called in stating she just tested positive for Covid with an at home test. Patient's symptoms began Monday morning. Patient is requesting an appt for prescribed medication to help with her symptoms, stating that is what was needed last time she had covid. Virtual appt made for further evaluation.   Copied from CRM [id number]. Topic: Clinical - Medication Question >> Jun 26, 2023  9:47 AM Alcus Dad wrote: Reason for CRM: Patient tested positive for covid and needs some medication for the symptoms Reason for Disposition  [1] COVID-19 infection suspected by caller or triager AND [2] mild symptoms (cough, fever, or others) AND [3] negative COVID-19 rapid test  Answer Assessment - Initial Assessment Questions 1. COVID-19 DIAGNOSIS: "How do you know that you have COVID?" (e.g., positive lab test or self-test, diagnosed by doctor or NP/PA, symptoms after exposure).     Home test just now 2. COVID-19 EXPOSURE: "Was there any known exposure to COVID before the symptoms began?" CDC Definition of close contact: within 6 feet (2 meters) for a total of 15 minutes or more over a 24-hour period.      No 3. ONSET: "When did the COVID-19 symptoms start?"      Early Morning monday 4. WORST SYMPTOM: "What is your worst symptom?" (e.g., cough, fever, shortness of breath, muscle aches)     Headache, face pain, teeth pain (sinus pressure) 5. COUGH: "Do you have a cough?" If Yes, ask: "How bad is the cough?"       Mild cough 6. FEVER: "Do you have a fever?" If Yes, ask: "What is your  temperature, how was it measured, and when did it start?"     Patient had fever Monday 7. RESPIRATORY STATUS: "Describe your breathing?" (e.g., normal; shortness of breath, wheezing, unable to speak)      With congestion it is causing mild shortness of breath 8. BETTER-SAME-WORSE: "Are you getting better, staying the same or getting worse compared to yesterday?"  If getting worse, ask, "In what way?"     Worse 9. OTHER SYMPTOMS: "Do you have any other symptoms?"  (e.g., chills, fatigue, headache, loss of smell or taste, muscle pain, sore throat)     Congestion, headache, loss of appetite, body aches 10. HIGH RISK DISEASE: "Do you have any chronic medical problems?" (e.g., asthma, heart or lung disease, weak immune system, obesity, etc.)       Irregular heartbeat 11. VACCINE: "Have you had the COVID-19 vaccine?" If Yes, ask: "Which one, how many shots, when did you get it?"       No 13. O2 SATURATION MONITOR:  "Do you use an oxygen saturation monitor (pulse oximeter) at home?" If Yes, ask "What is your reading (oxygen level) today?" "What is your usual oxygen saturation reading?" (e.g., 95%)       No  Protocols used: Coronavirus (COVID-19) Diagnosed or Suspected-A-AH

## 2023-06-26 NOTE — Assessment & Plan Note (Signed)
 Pt has sinus pressure, congestion, fever, sore throat, SOB due to congestion and body aches. -Symptomatic treatment discussed with pt. -Will start of prednisone 40 mg once a day for 5 days, azithromycin, tessalon perls and guaifenesin. Increase fluid intake and take tylenol for fever and body aches.  -Advised to check oxygen saturation at home. -Increase fluid intake. -Red flag discussed.  Patient was provided clear instructions to go to ER or urgent care if symptoms does not improve, red flag or new problem develops.  Patient verbalized understanding.

## 2023-06-26 NOTE — Patient Instructions (Addendum)
 Medication sent to the pharmacy.  Pick plain mucinex OTC.  Isolation Instructions: You are to isolate at home for 5 days from onset of your symptoms. If you must be around other household members who do not have symptoms, you need to make sure that both you and the family members are masking consistently with a high-quality mask.   After day 5 of isolation, if you have had no fever within 24 hours and you are feeling better, you can end isolation but need to mask for an additional 5 days.   After day 5 if you have a fever or are having significant symptoms, please isolate for full 10 days.   If you note any worsening of symptoms despite treatment, please seek an in-person evaluation ASAP. If you note any significant shortness of breath or any chest pain, please seek ER evaluation.

## 2023-08-04 DIAGNOSIS — Z1339 Encounter for screening examination for other mental health and behavioral disorders: Secondary | ICD-10-CM | POA: Diagnosis not present

## 2023-08-04 DIAGNOSIS — Z1331 Encounter for screening for depression: Secondary | ICD-10-CM | POA: Diagnosis not present

## 2023-08-04 DIAGNOSIS — J209 Acute bronchitis, unspecified: Secondary | ICD-10-CM | POA: Diagnosis not present

## 2023-08-04 DIAGNOSIS — R0981 Nasal congestion: Secondary | ICD-10-CM | POA: Diagnosis not present

## 2023-08-29 DIAGNOSIS — Z01419 Encounter for gynecological examination (general) (routine) without abnormal findings: Secondary | ICD-10-CM | POA: Diagnosis not present

## 2023-08-29 DIAGNOSIS — Z1231 Encounter for screening mammogram for malignant neoplasm of breast: Secondary | ICD-10-CM | POA: Diagnosis not present

## 2023-08-29 DIAGNOSIS — Z1331 Encounter for screening for depression: Secondary | ICD-10-CM | POA: Diagnosis not present

## 2023-08-29 LAB — HM MAMMOGRAPHY

## 2023-09-13 DIAGNOSIS — Z6841 Body Mass Index (BMI) 40.0 and over, adult: Secondary | ICD-10-CM | POA: Diagnosis not present

## 2023-09-13 DIAGNOSIS — M542 Cervicalgia: Secondary | ICD-10-CM | POA: Diagnosis not present

## 2024-01-31 DIAGNOSIS — S93691A Other sprain of right foot, initial encounter: Secondary | ICD-10-CM | POA: Diagnosis not present
